# Patient Record
Sex: Female | Born: 1990 | Race: White | Hispanic: No | Marital: Single | Smoking: Current every day smoker
Health system: Southern US, Community
[De-identification: ages and names within clinical notes are randomized; demographics above are authoritative.]

## PROBLEM LIST (undated history)

## (undated) ENCOUNTER — Inpatient Hospital Stay (HOSPITAL_COMMUNITY): Payer: Self-pay

## (undated) DIAGNOSIS — K219 Gastro-esophageal reflux disease without esophagitis: Secondary | ICD-10-CM

## (undated) DIAGNOSIS — D649 Anemia, unspecified: Secondary | ICD-10-CM

## (undated) DIAGNOSIS — F909 Attention-deficit hyperactivity disorder, unspecified type: Secondary | ICD-10-CM

## (undated) DIAGNOSIS — Z87442 Personal history of urinary calculi: Secondary | ICD-10-CM

## (undated) DIAGNOSIS — R0600 Dyspnea, unspecified: Secondary | ICD-10-CM

---
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
## 2011-01-29 LAB — CBC
HCT: 36.5 % (ref 36.0–49.0)
Hemoglobin: 12.6 g/dL (ref 12.0–16.0)
MCHC: 34.6 g/dL (ref 31.0–37.0)
MCV: 86.7 fL (ref 78.0–98.0)
Platelets: 326 10*3/uL (ref 150–400)
RBC: 4.21 MIL/uL (ref 3.80–5.70)
RDW: 12.6 % (ref 11.4–15.5)
WBC: 7.7 10*3/uL (ref 4.5–13.5)

## 2011-01-29 LAB — BASIC METABOLIC PANEL
BUN: 8 mg/dL (ref 6–23)
CO2: 28 mEq/L (ref 19–32)
Calcium: 9.1 mg/dL (ref 8.4–10.5)
Chloride: 107 mEq/L (ref 96–112)
Creatinine, Ser: 0.76 mg/dL (ref 0.4–1.2)
Glucose, Bld: 97 mg/dL (ref 70–99)
Potassium: 3.6 mEq/L (ref 3.5–5.1)
Sodium: 139 mEq/L (ref 135–145)

## 2011-01-29 LAB — PREGNANCY, URINE: Preg Test, Ur: NEGATIVE

## 2011-01-29 LAB — DIFFERENTIAL
Basophils Absolute: 0 10*3/uL (ref 0.0–0.1)
Basophils Relative: 0 % (ref 0–1)
Eosinophils Absolute: 0.2 10*3/uL (ref 0.0–1.2)
Eosinophils Relative: 2 % (ref 0–5)
Lymphocytes Relative: 26 % (ref 24–48)
Lymphs Abs: 2 10*3/uL (ref 1.1–4.8)
Monocytes Absolute: 0.9 10*3/uL (ref 0.2–1.2)
Monocytes Relative: 12 % — ABNORMAL HIGH (ref 3–11)
Neutro Abs: 4.6 10*3/uL (ref 1.7–8.0)
Neutrophils Relative %: 60 % (ref 43–71)

## 2011-01-29 LAB — RAPID URINE DRUG SCREEN, HOSP PERFORMED
Amphetamines: NOT DETECTED
Barbiturates: NOT DETECTED
Benzodiazepines: NOT DETECTED
Cocaine: NOT DETECTED
Opiates: NOT DETECTED
Tetrahydrocannabinol: NOT DETECTED

## 2011-01-29 LAB — ETHANOL: Alcohol, Ethyl (B): <5 mg/dL (ref 0–10)
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
## 2011-07-19 LAB — CBC
HCT: 33.3 — ABNORMAL LOW
Hemoglobin: 11.5 — ABNORMAL LOW
MCHC: 34.7
MCV: 85
Platelets: 319
RBC: 3.92
RDW: 12.7
WBC: 25.1 — ABNORMAL HIGH

## 2011-07-19 LAB — POCT I-STAT, CHEM 8
BUN: 7
Calcium, Ion: 1.2
Chloride: 104
Creatinine, Ser: 0.9
Glucose, Bld: 93
HCT: 35 — ABNORMAL LOW
Hemoglobin: 11.9 — ABNORMAL LOW
Potassium: 4.3
Sodium: 138
TCO2: 22

## 2011-07-19 LAB — TRICYCLICS SCREEN, URINE: TCA Scrn: NOT DETECTED

## 2011-07-19 LAB — DIFFERENTIAL
Basophils Absolute: 0
Basophils Relative: 0
Eosinophils Absolute: 0
Eosinophils Relative: 0
Lymphocytes Relative: 5 — ABNORMAL LOW
Lymphs Abs: 1.3
Monocytes Absolute: 1
Monocytes Relative: 4
Neutro Abs: 22.8 — ABNORMAL HIGH
Neutrophils Relative %: 91 — ABNORMAL HIGH
Smear Review: ADEQUATE
WBC Morphology: INCREASED

## 2011-07-19 LAB — RAPID URINE DRUG SCREEN, HOSP PERFORMED
Amphetamines: NOT DETECTED
Barbiturates: NOT DETECTED
Benzodiazepines: NOT DETECTED
Cocaine: NOT DETECTED
Opiates: NOT DETECTED
Tetrahydrocannabinol: POSITIVE — AB

## 2011-07-19 LAB — ETHANOL: Alcohol, Ethyl (B): <5

## 2011-07-19 LAB — HEPATIC FUNCTION PANEL
ALT: 12
AST: 19
Albumin: 3.5
Alkaline Phosphatase: 80
Bilirubin, Direct: <0.1
Total Bilirubin: 0.7
Total Protein: 5.6 — ABNORMAL LOW

## 2011-07-19 LAB — ACETAMINOPHEN LEVEL: Acetaminophen (Tylenol), Serum: 32.8 — ABNORMAL HIGH

## 2011-07-19 LAB — SALICYLATE LEVEL: Salicylate Lvl: <4

## 2011-07-19 LAB — PREGNANCY, URINE: Preg Test, Ur: NEGATIVE

## 2012-06-14 ENCOUNTER — Other Ambulatory Visit (HOSPITAL_COMMUNITY): Payer: Self-pay | Admitting: Oral Surgery

## 2012-06-22 ENCOUNTER — Encounter (HOSPITAL_COMMUNITY): Payer: Self-pay

## 2012-06-27 ENCOUNTER — Encounter (HOSPITAL_COMMUNITY): Payer: Self-pay | Admitting: *Deleted

## 2012-06-28 ENCOUNTER — Encounter (HOSPITAL_COMMUNITY): Payer: Self-pay | Admitting: Anesthesiology

## 2012-06-28 ENCOUNTER — Encounter (HOSPITAL_COMMUNITY)
Admission: RE | Disposition: A | Discharge status: Home / Self Care | Payer: Self-pay | Source: Ambulatory Visit | Attending: Oral Surgery

## 2012-06-28 ENCOUNTER — Ambulatory Visit (HOSPITAL_COMMUNITY): Payer: Medicaid Other | Admitting: Anesthesiology

## 2012-06-28 ENCOUNTER — Encounter (HOSPITAL_COMMUNITY): Payer: Self-pay | Admitting: *Deleted

## 2012-06-28 ENCOUNTER — Ambulatory Visit (HOSPITAL_COMMUNITY)
Admission: RE | Admit: 2012-06-28 | Discharge: 2012-06-28 | Disposition: A | Discharge status: Home / Self Care | Payer: Medicaid Other | Source: Ambulatory Visit | Attending: Oral Surgery | Admitting: Oral Surgery

## 2012-06-28 DIAGNOSIS — K029 Dental caries, unspecified: Secondary | ICD-10-CM | POA: Insufficient documentation

## 2012-06-28 DIAGNOSIS — F909 Attention-deficit hyperactivity disorder, unspecified type: Secondary | ICD-10-CM | POA: Insufficient documentation

## 2012-06-28 DIAGNOSIS — K05329 Chronic periodontitis, generalized, unspecified severity: Secondary | ICD-10-CM

## 2012-06-28 HISTORY — PX: MULTIPLE EXTRACTIONS WITH ALVEOLOPLASTY: SHX5342

## 2012-06-28 HISTORY — DX: Attention-deficit hyperactivity disorder, unspecified type: F90.9

## 2012-06-28 SURGERY — MULTIPLE EXTRACTION WITH ALVEOLOPLASTY
Anesthesia: General | Site: Mouth | Laterality: Bilateral | Wound class: Clean Contaminated

## 2012-06-28 MED ORDER — LIDOCAINE HCL 2 % IJ SOLN
INTRAMUSCULAR | Status: AC
  Filled 2012-06-28: qty 1

## 2012-06-28 MED ORDER — OXYMETAZOLINE HCL 0.05 % NA SOLN
NASAL | Status: AC
Start: 2012-06-28 — End: 2012-06-28
  Filled 2012-06-28: qty 15

## 2012-06-28 MED ORDER — GLYCOPYRROLATE 0.2 MG/ML IJ SOLN
INTRAMUSCULAR | Status: DC | PRN
  Administered 2012-06-28: 0.6 mg via INTRAVENOUS

## 2012-06-28 MED ORDER — LACTATED RINGERS IV SOLN
INTRAVENOUS | Status: DC | PRN
  Administered 2012-06-28: 11:00:00 via INTRAVENOUS

## 2012-06-28 MED ORDER — LACTATED RINGERS IV SOLN
INTRAVENOUS | Status: DC
  Administered 2012-06-28: 50 mL/h via INTRAVENOUS

## 2012-06-28 MED ORDER — SODIUM CHLORIDE 0.9 % IR SOLN
Status: DC | PRN
  Administered 2012-06-28: 2000 mL

## 2012-06-28 MED ORDER — HYDROMORPHONE HCL PF 1 MG/ML IJ SOLN
INTRAMUSCULAR | Status: AC
  Filled 2012-06-28: qty 1

## 2012-06-28 MED ORDER — OXYMETAZOLINE HCL 0.05 % NA SOLN
NASAL | Status: DC | PRN
  Administered 2012-06-28: 1 via NASAL

## 2012-06-28 MED ORDER — ROCURONIUM BROMIDE 100 MG/10ML IV SOLN
INTRAVENOUS | Status: DC | PRN
  Administered 2012-06-28: 30 mg via INTRAVENOUS

## 2012-06-28 MED ORDER — ONDANSETRON HCL 4 MG/2ML IJ SOLN
INTRAMUSCULAR | Status: DC | PRN
  Administered 2012-06-28: 4 mg via INTRAVENOUS

## 2012-06-28 MED ORDER — CEFAZOLIN SODIUM 1-5 GM-% IV SOLN
INTRAVENOUS | Status: AC
  Filled 2012-06-28: qty 50

## 2012-06-28 MED ORDER — ONDANSETRON HCL 4 MG/2ML IJ SOLN: 4.0000 mg | Freq: Four times a day (QID) | INTRAMUSCULAR | Status: DC | PRN

## 2012-06-28 MED ORDER — FENTANYL CITRATE 0.05 MG/ML IJ SOLN
INTRAMUSCULAR | Status: DC | PRN
  Administered 2012-06-28: 50 ug via INTRAVENOUS

## 2012-06-28 MED ORDER — LIDOCAINE HCL (CARDIAC) 20 MG/ML IV SOLN
INTRAVENOUS | Status: DC | PRN
  Administered 2012-06-28: 80 mg via INTRAVENOUS

## 2012-06-28 MED ORDER — PROPOFOL 10 MG/ML IV EMUL
INTRAVENOUS | Status: DC | PRN
  Administered 2012-06-28: 200 mg via INTRAVENOUS

## 2012-06-28 MED ORDER — MIDAZOLAM HCL 5 MG/5ML IJ SOLN
INTRAMUSCULAR | Status: DC | PRN
  Administered 2012-06-28: 2 mg via INTRAVENOUS

## 2012-06-28 MED ORDER — NEOSTIGMINE METHYLSULFATE 1 MG/ML IJ SOLN
INTRAMUSCULAR | Status: DC | PRN
  Administered 2012-06-28: 4 mg via INTRAVENOUS

## 2012-06-28 MED ORDER — OXYCODONE-ACETAMINOPHEN 5-325 MG PO TABS: 1.0000 | ORAL_TABLET | ORAL | Status: AC | PRN

## 2012-06-28 MED ORDER — LIDOCAINE-EPINEPHRINE 2 %-1:100000 IJ SOLN
INTRAMUSCULAR | Status: DC | PRN
  Administered 2012-06-28: 16 mL

## 2012-06-28 MED ORDER — CEFAZOLIN SODIUM 1-5 GM-% IV SOLN
INTRAVENOUS | Status: DC | PRN
  Administered 2012-06-28: 1 g via INTRAVENOUS

## 2012-06-28 MED ORDER — HYDROMORPHONE HCL PF 1 MG/ML IJ SOLN
0.2500 mg | INTRAMUSCULAR | Status: DC | PRN
  Administered 2012-06-28: 0.5 mg via INTRAVENOUS

## 2012-06-28 SURGICAL SUPPLY — 29 items
BUR CROSS CUT FISSURE 1.6 (BURR) ×2 IMPLANT
BUR EGG ELITE 4.0 (BURR) ×2 IMPLANT
CANISTER SUCTION 2500CC (MISCELLANEOUS) ×2 IMPLANT
CLOTH BEACON ORANGE TIMEOUT ST (SAFETY) ×2 IMPLANT
COVER SURGICAL LIGHT HANDLE (MISCELLANEOUS) ×2 IMPLANT
CRADLE DONUT ADULT HEAD (MISCELLANEOUS) ×2 IMPLANT
DECANTER SPIKE VIAL GLASS SM (MISCELLANEOUS) ×2 IMPLANT
GAUZE PACKING FOLDED 2  STR (GAUZE/BANDAGES/DRESSINGS) ×1
GAUZE PACKING FOLDED 2 STR (GAUZE/BANDAGES/DRESSINGS) ×1 IMPLANT
GLOVE BIO SURGEON STRL SZ 6.5 (GLOVE) ×2 IMPLANT
GLOVE BIO SURGEON STRL SZ7 (GLOVE) ×2 IMPLANT
GLOVE BIO SURGEON STRL SZ7.5 (GLOVE) ×2 IMPLANT
GLOVE BIOGEL PI IND STRL 7.0 (GLOVE) ×1 IMPLANT
GLOVE BIOGEL PI INDICATOR 7.0 (GLOVE) ×1
GOWN STRL NON-REIN LRG LVL3 (GOWN DISPOSABLE) ×4 IMPLANT
GOWN STRL REIN XL XLG (GOWN DISPOSABLE) ×2 IMPLANT
KIT BASIN OR (CUSTOM PROCEDURE TRAY) ×2 IMPLANT
KIT ROOM TURNOVER OR (KITS) ×2 IMPLANT
NEEDLE 22X1 1/2 (OR ONLY) (NEEDLE) ×2 IMPLANT
NS IRRIG 1000ML POUR BTL (IV SOLUTION) ×2 IMPLANT
PAD ARMBOARD 7.5X6 YLW CONV (MISCELLANEOUS) ×4 IMPLANT
SUT CHROMIC 3 0 PS 2 (SUTURE) ×2 IMPLANT
SYR 50ML SLIP (SYRINGE) IMPLANT
SYR CONTROL 10ML LL (SYRINGE) ×2 IMPLANT
TOWEL OR 17X26 10 PK STRL BLUE (TOWEL DISPOSABLE) ×2 IMPLANT
TRAY ENT MC OR (CUSTOM PROCEDURE TRAY) ×2 IMPLANT
TUBING IRRIGATION (MISCELLANEOUS) ×2 IMPLANT
WATER STERILE IRR 1000ML POUR (IV SOLUTION) ×2 IMPLANT
YANKAUER SUCT BULB TIP NO VENT (SUCTIONS) ×2 IMPLANT

## 2012-06-28 NOTE — Anesthesia Postprocedure Evaluation (Signed)
Anesthesia Post Note  Patient: [name]  Procedure(s) Performed: Procedure(s) (LRB): MULTIPLE EXTRACION WITH ALVEOLOPLASTY (Bilateral)  Anesthesia type: General  Patient location: PACU  Post pain: Pain level controlled and Adequate analgesia  Post assessment: Post-op Vital signs reviewed, Patient's Cardiovascular Status Stable, Respiratory Function Stable, Patent Airway and Pain level controlled  Last Vitals:  Filed Vitals:   06/28/12 1303  BP: 152/88  Pulse: 76  Temp:   Resp: 17    Post vital signs: Reviewed and stable  Level of consciousness: awake, alert  and oriented  Complications: No apparent anesthesia complications

## 2012-06-28 NOTE — Op Note (Signed)
06/28/2012  12:21 PM  PATIENT:  Noberto Retort  21 y.o. female  PRE-OPERATIVE DIAGNOSIS:  Non-restorable teeth #'s 2, 3, 4, 5, 6, 7, 8, 9, 10, 11, 13, 14, 19, 20, 22, 23, 24, 25, 26, 27  POST-OPERATIVE DIAGNOSIS:  SAME  PROCEDURE:  Procedure(s): MULTIPLE EXTRACTON Non-restorable teeth #'s 2, 3, 4, 5, 6, 7, 8, 9, 10, 11, 13, 14, 19, 20, 22, 23, 24, 25, 26, 27 WITH ALVEOLOPLASTY  SURGEON:  Surgeon(s): Georgia Lopes, DDS  ANESTHESIA:   local and general  EBL:  minimal  DRAINS: none   SPECIMEN:  No Specimen  COUNTS:  YES  PLAN OF CARE: Discharge to home after PACU  PATIENT DISPOSITION:  PACU - hemodynamically stable.   PROCEDURE DETAILS: Dictation #[id number]  Georgia Lopes, DMD 06/28/2012 12:21 PM

## 2012-06-28 NOTE — Transfer of Care (Signed)
Immediate Anesthesia Transfer of Care Note  Patient: [name]  Procedure(s) Performed: Procedure(s) (LRB) with comments: MULTIPLE EXTRACION WITH ALVEOLOPLASTY (Bilateral) - extract teeth numbers 2,3,4,5,6,7,8,9,10,11,13,14,19,20,22,23,24,26,27 with alveoplasty all quadrants   Patient Location: PACU  Anesthesia Type: General  Level of Consciousness: sedated and patient cooperative  Airway & Oxygen Therapy: Patient Spontanous Breathing and Patient connected to nasal cannula oxygen  Post-op Assessment: Report given to PACU RN, Post -op Vital signs reviewed and stable and Patient moving all extremities X 4  Post vital signs: Reviewed and stable  Complications: No apparent anesthesia complications

## 2012-06-28 NOTE — Anesthesia Preprocedure Evaluation (Addendum)
Anesthesia Evaluation  Patient identified by MRN, date of birth, ID band Patient awake    Reviewed: Allergy & Precautions, H&P , NPO status , Patient's Chart, lab work & pertinent test results  Airway Mallampati: II  Neck ROM: full    Dental  (+) Dental Advisory Given and Poor Dentition   Pulmonary          Cardiovascular     Neuro/Psych ADHD   GI/Hepatic   Endo/Other    Renal/GU      Musculoskeletal   Abdominal   Peds  Hematology   Anesthesia Other Findings   Reproductive/Obstetrics                          Anesthesia Physical Anesthesia Plan  ASA: II  Anesthesia Plan: General   Post-op Pain Management:    Induction: Intravenous  Airway Management Planned: Nasal ETT  Additional Equipment:   Intra-op Plan:   Post-operative Plan: Extubation in OR  Informed Consent: I have reviewed the patients History and Physical, chart, labs and discussed the procedure including the risks, benefits and alternatives for the proposed anesthesia with the patient or authorized representative who has indicated his/her understanding and acceptance.     Plan Discussed with: CRNA and Surgeon  Anesthesia Plan Comments:         Anesthesia Quick Evaluation

## 2012-06-28 NOTE — H&P (Signed)
HISTORY AND PHYSICAL  [name] is a 21 y.o. female patient with CC: painful teeth.  No diagnosis found.  Past Medical History  Diagnosis Date  . ADHD (attention deficit hyperactivity disorder)     Current Facility-Administered Medications  Medication Dose Route Frequency Provider Last Rate Last Dose  . lactated ringers infusion   Intravenous Continuous Georgia Lopes, DDS 50 mL/hr at 06/28/12 1043 50 mL/hr at 06/28/12 1043   Facility-Administered Medications Ordered in Other Encounters  Medication Dose Route Frequency Provider Last Rate Last Dose  . lactated ringers infusion    Continuous PRN Sherie Don, CRNA       No Known Allergies Active Problems:  * No active hospital problems. *   Vitals: Blood pressure 116/77, pulse 86, temperature 98.4 F (36.9 C), temperature source Oral, resp. rate 16, height 5\' 2"  (1.575 m), weight 86.5 kg (190 lb 11.2 oz), SpO2 100.00%. Lab results:No results found for this or any previous visit (from the past 24 hour(s)). Radiology Results: No results found. General appearance: alert, cooperative, appears stated age and no distress Head: Normocephalic, without obvious abnormality, atraumatic Eyes: negative Ears: normal TM's and external ear canals both ears Nose: Nares normal. Septum midline. Mucosa normal. No drainage or sinus tenderness. Throat: severe dental caries, periodontal disease all remaining teeth Neck: no adenopathy and supple, symmetrical, trachea midline Resp: clear to auscultation bilaterally Cardio: regular rate and rhythm, S1, S2 normal, no murmur, click, rub or gallop GI: soft, non-tender; bowel sounds normal; no masses,  no organomegaly  Assessment: 21 YO WF ADHD, mild obesity, with all remaining teeth non-restorable due to dental caries and severe periodontitis.   Plan: Full mouth dental extractions, alveoloplasty. General anesthesia day surgery.   Erez Mccallum M 06/28/2012

## 2012-06-28 NOTE — OR Nursing (Signed)
Throat pack in at 11:44

## 2012-06-28 NOTE — Anesthesia Procedure Notes (Signed)
Procedure Name: Intubation Date/Time: 06/28/2012 11:22 AM Performed by: Sherie Don Pre-anesthesia Checklist: Patient identified, Emergency Drugs available, Suction available, Patient being monitored and Timeout performed Patient Re-evaluated:Patient Re-evaluated prior to inductionOxygen Delivery Method: Circle system utilized Preoxygenation: Pre-oxygenation with 100% oxygen Intubation Type: IV induction Ventilation: Mask ventilation without difficulty Laryngoscope Size: Mac and 3 Grade View: Grade II Nasal Tubes: Magill forceps - small, utilized, Right and Nasal prep performed Tube size: 7.0 mm Number of attempts: 1 Placement Confirmation: ETT inserted through vocal cords under direct vision,  positive ETCO2 and breath sounds checked- equal and bilateral Tube secured with: Tape Dental Injury: Teeth and Oropharynx as per pre-operative assessment

## 2012-06-28 NOTE — Preoperative (Signed)
Beta Blockers   Reason not to administer Beta Blockers:Not Applicable 

## 2012-06-29 ENCOUNTER — Encounter (HOSPITAL_COMMUNITY): Payer: Self-pay | Admitting: Oral Surgery

## 2012-06-29 NOTE — Op Note (Signed)
[name], [name]             ACCOUNT NO.:  [id number]  MEDICAL RECORD NO.:  [id number]  LOCATION:  [facility]                         FACILITY:  [facility]  PHYSICIAN:  Georgia Lopes, M.D.  DATE OF BIRTH:  [birth year]  DATE OF PROCEDURE:  06/28/2012 DATE OF DISCHARGE:  06/28/2012                              OPERATIVE REPORT   PREOPERATIVE DIAGNOSIS:  Nonrestorable teeth numbers 2, 3, 4, 5, 6, 7, 8, 9, 10, 11, 13, 14, 19, 20, 22, 23, 24, 25, 26, 27.  POSTOPERATIVE DIAGNOSIS:  Nonrestorable teeth numbers 2, 3, 4, 5, 6, 7, 8, 9, 10, 11, 13, 14, 19, 20, 22, 23, 24, 25, 26, 27.  PROCEDURES:  Removal of teeth numbers 2, 3, 4, 5, 6, 7, 8, 9, 10, 11, 13, 14, 19, 20, 22, 23, 24, 25, 26, 27.  Alveoplasty, right and left maxilla and mandible.  SURGEON:  Georgia Lopes, MD  ANESTHESIA:  General, nasal.  INDICATIONS FOR PROCEDURE:  [name] is a 21 year old female who was referred to me by her general dentist for removal of all remaining teeth.  She had previously undergone extraction in my office under intravenous sedation of teeth numbers 1, 12, 16, 21, 28, 29, and 30 secondary to dental decay.  She was examined and found to have gross dental caries and periodontal disease around all remaining teeth. Because of the condition of the teeth, it was recommended that full mouth extraction will be performed with alveoplasty.  General anesthesia was recommended for the case because of the extensiveness of the surgery.  PROCEDURE:  The patient was taken to the operating room and placed on the table in the supine position.  General anesthesia was administered intravenously and a nasal endotracheal tube was placed and secured.  The eyes were protected.  The patient was draped for the procedure.  Time- out was performed.  The posterior pharynx was suctioned and a throat pack was placed.  A 2% lidocaine with 1:100,000 epinephrine was infiltrated in the inferior alveolar nerve block on the right and  left side and then buccal and palatal infiltration of the maxilla, total of 16 mL was utilized.  Then, a bite block and sweetheart retractor was placed inside the mouth and a 15-blade was used to make a full-thickness incision beginning in the left mandible starting at tooth number 19, carrying anteriorly, buckling and lingually to tooth number 27.  In the maxilla on the left side, the 15-blade was used to make a full-thickness incision around teeth numbers 13, 14, 11, 10, 9, 8, 7, 6 on the buccal and palatal aspects.  Then, the periosteal elevator was used to reflect the periosteum from around these teeth and this handpiece under irrigation was used to remove circumferential bone around teeth numbers 11, 13, 14, 19, 20, and 22.  The teeth were then elevated with a 301 elevator and removed from the mouth with the Asch forceps in the lower anterior and the upper #150 forceps in the maxilla.  Then, the sockets were curetted.  The periosteum was further reflected to expose the alveolar crest with the periosteal elevator in the mandible.  A Seldin retractor was used to retract the lingual tissues and the  egg-shaped bur was then used under irrigation to perform the alveoplasty and was further smoothed with a bone file.  In the maxilla, after the periosteum was reflected, the egg-shaped bur and bone file were used to perform the alveoplasty in the left maxilla.  Then, the left maxilla and mandible were irrigated and closed with 3-0 chromic.  The bite block was repositioned as was the sweetheart retractor and the 15-blade was used to make a full-thickness incision around teeth numbers 2, 3, 4, 5, and 6 in the maxilla on the buccal and palatal aspects and in the mandible around tooth number 27 on the buccal and lingual aspects and with a distal incision of approximately 1 cm to remove a wedge of tissue to allow for primary closure.  Periosteum was reflected.  Bone was removed with the Striker  handpiece under irrigation.  The teeth were elevated with a 301 elevator and removed from the mouth with a dental forceps. The sockets were then curetted and then, the periosteum was further reflected, alveoplasty was performed with the egg-shaped bur and the bone file, and then the areas were irrigated and closed with 3-0 chromic.  Then, the oral cavity was inspected, found to have good contour, hemostasis and closure.  The oral cavity was irrigated and suctioned, and the throat pack was removed.  The patient was awakened and taken to the recovery room, breathing spontaneously in good condition.  EBL:  Minimum.  COMPLICATIONS:  None.  SPECIMENS:  None.     Georgia Lopes, M.D.     SMJ/MEDQ  D:  06/28/2012  T:  06/29/2012  Job:  [id number]

## 2012-07-04 ENCOUNTER — Other Ambulatory Visit (HOSPITAL_COMMUNITY): Payer: Medicaid Other

## 2012-08-04 ENCOUNTER — Emergency Department (HOSPITAL_COMMUNITY): Payer: Medicaid Other

## 2012-08-04 ENCOUNTER — Encounter (HOSPITAL_COMMUNITY): Payer: Self-pay | Admitting: *Deleted

## 2012-08-04 ENCOUNTER — Emergency Department (HOSPITAL_COMMUNITY)
Admission: EM | Admit: 2012-08-04 | Discharge: 2012-08-04 | Disposition: A | Discharge status: Home / Self Care | Payer: Medicaid Other | Attending: Emergency Medicine | Admitting: Emergency Medicine

## 2012-08-04 DIAGNOSIS — R109 Unspecified abdominal pain: Secondary | ICD-10-CM | POA: Insufficient documentation

## 2012-08-04 DIAGNOSIS — Z79899 Other long term (current) drug therapy: Secondary | ICD-10-CM | POA: Insufficient documentation

## 2012-08-04 DIAGNOSIS — F909 Attention-deficit hyperactivity disorder, unspecified type: Secondary | ICD-10-CM | POA: Insufficient documentation

## 2012-08-04 DIAGNOSIS — F17200 Nicotine dependence, unspecified, uncomplicated: Secondary | ICD-10-CM | POA: Insufficient documentation

## 2012-08-04 DIAGNOSIS — N390 Urinary tract infection, site not specified: Secondary | ICD-10-CM

## 2012-08-04 DIAGNOSIS — R339 Retention of urine, unspecified: Secondary | ICD-10-CM | POA: Insufficient documentation

## 2012-08-04 LAB — URINALYSIS, ROUTINE W REFLEX MICROSCOPIC
Bilirubin Urine: NEGATIVE
Glucose, UA: NEGATIVE mg/dL
Ketones, ur: NEGATIVE mg/dL
Nitrite: NEGATIVE
Protein, ur: NEGATIVE mg/dL
Specific Gravity, Urine: 1.01 (ref 1.005–1.030)
Urobilinogen, UA: 0.2 mg/dL (ref 0.0–1.0)
pH: 7 (ref 5.0–8.0)

## 2012-08-04 LAB — URINE MICROSCOPIC-ADD ON

## 2012-08-04 LAB — PREGNANCY, URINE: Preg Test, Ur: NEGATIVE

## 2012-08-04 MED ORDER — ONDANSETRON HCL 4 MG/2ML IJ SOLN
4.0000 mg | Freq: Once | INTRAMUSCULAR | Status: AC
  Administered 2012-08-04: 4 mg via INTRAVENOUS
  Filled 2012-08-04: qty 2

## 2012-08-04 MED ORDER — PROMETHAZINE HCL 25 MG PO TABS: 25.0000 mg | ORAL_TABLET | Freq: Four times a day (QID) | ORAL | Status: DC | PRN

## 2012-08-04 MED ORDER — SODIUM CHLORIDE 0.9 % IV BOLUS (SEPSIS)
500.0000 mL | Freq: Once | INTRAVENOUS | Status: AC
  Administered 2012-08-04: 500 mL via INTRAVENOUS

## 2012-08-04 MED ORDER — OXYCODONE-ACETAMINOPHEN 5-325 MG PO TABS: 1.0000 | ORAL_TABLET | Freq: Four times a day (QID) | ORAL | Status: DC | PRN

## 2012-08-04 MED ORDER — NITROFURANTOIN MONOHYD MACRO 100 MG PO CAPS: 100.0000 mg | ORAL_CAPSULE | Freq: Two times a day (BID) | ORAL | Status: DC

## 2012-08-04 MED ORDER — KETOROLAC TROMETHAMINE 30 MG/ML IJ SOLN
30.0000 mg | Freq: Once | INTRAMUSCULAR | Status: AC
  Administered 2012-08-04: 30 mg via INTRAVENOUS
  Filled 2012-08-04: qty 1

## 2012-08-04 NOTE — ED Provider Notes (Signed)
History   This chart was scribed for Michelle Hutching, MD scribed by Magnus Sinning. The patient was seen in room APA03/[facility]03 at 19:42   CSN: [id number]  Arrival date & time 08/04/12  1832   Chief Complaint  Patient presents with  . Flank Pain    (Consider location/radiation/quality/duration/timing/severity/associated sxs/prior treatment) The history is provided by the patient. No language interpreter was used.   [name] is a 21 y.o. female who presents to the Emergency Department complaining of constant moderate LLQ pain that radiates to left flank, onset last night and worsening this morning. Pt states she started having LLQ pain yesterday, but explains it began to radiate into left flank this morning and with associated urinary retention today. Patient states she does consume several sugary carbonated beverages, but states she has recently attempted to drink more water. Pt states that she's been eating normally and denies emesis, diarrhea, vaginal bleeding, or vaginal discharge. LNMP: Over a year, due to Depo-provera shot, which was last administered yesterday morning.  Past Medical History  Diagnosis Date  . ADHD (attention deficit hyperactivity disorder)     Past Surgical History  Procedure Date  . No past surgeries   . Multiple extractions with alveoloplasty 06/28/2012    Procedure: MULTIPLE EXTRACION WITH ALVEOLOPLASTY;  Surgeon: Georgia Lopes, DDS;  Location: [facility] OR;  Service: Oral Surgery;  Laterality: Bilateral;  extract teeth numbers 2,3,4,5,6,7,8,9,10,11,13,14,19,20,22,23,24,26,27 with alveoplasty all quadrants     No family history on file.  History  Substance Use Topics  . Smoking status: Current Every Day Smoker -- 1.0 packs/day for 7 years    Types: Cigarettes  . Smokeless tobacco: Not on file  . Alcohol Use: Yes     maybe once a month- maybe 2 shots   Review of Systems  All other systems reviewed and are negative.   10 Systems reviewed and are negative  for acute change except as noted in the HPI. Allergies  Review of patient's allergies indicates no known allergies.  Home Medications   Current Outpatient Rx  Name Route Sig Dispense Refill  . AMPHETAMINE-DEXTROAMPHET ER 10 MG PO CP24 Oral Take 10 mg by mouth daily.    Marland Kitchen MEDROXYPROGESTERONE ACETATE 150 MG/ML IM SUSP Intramuscular Inject 150 mg into the muscle every 3 (three) months.    Marland Kitchen VALACYCLOVIR HCL 1 G PO TABS Oral Take 2,000 mg by mouth 2 (two) times daily as needed. For cold sores      BP 143/92  Pulse 84  Temp 99.4 F (37.4 C) (Oral)  Resp 18  Ht 5\' 2"  (1.575 m)  Wt 185 lb (83.915 kg)  BMI 33.84 kg/m2  SpO2 100%  Physical Exam  Nursing note and vitals reviewed. Constitutional: She is oriented to person, place, and time. She appears well-developed and well-nourished. No distress.  HENT:  Head: Normocephalic and atraumatic.  Eyes: Conjunctivae normal and EOM are normal. Pupils are equal, round, and reactive to light.  Neck: Normal range of motion. Neck supple.  Cardiovascular: Normal rate, regular rhythm and normal heart sounds.   Pulmonary/Chest: Effort normal and breath sounds normal.  Abdominal: Soft. Bowel sounds are normal.       Mildy Tender at LLQ and left flank  Musculoskeletal: Normal range of motion.  Neurological: She is alert and oriented to person, place, and time.  Skin: Skin is warm and dry.  Psychiatric: She has a normal mood and affect.    ED Course  Procedures (including critical care time) DIAGNOSTIC STUDIES:  Oxygen Saturation is 100% on room air, normal by my interpretation.    COORDINATION OF CARE: 19:45: Informed to check urine for infection. Pt   Labs Reviewed  PREGNANCY, URINE  URINALYSIS, ROUTINE W REFLEX MICROSCOPIC   Ct Abdomen Pelvis Wo Contrast  08/04/2012  *RADIOLOGY REPORT*  Clinical Data: Left flank pain, dysuria  CT ABDOMEN AND PELVIS WITHOUT CONTRAST  Technique:  Multidetector CT imaging of the abdomen and pelvis was  performed following the standard protocol without intravenous contrast.  Comparison: None.  Findings: Lung bases are clear.  Mild hepatic steatosis.  Spleen, pancreas, and adrenal glands are within normal limits.  Gallbladder is unremarkable.  No intrahepatic or extrahepatic ductal dilatation.  Kidneys are unremarkable.  No renal calculi or hydronephrosis.  No evidence of bowel obstruction.  Normal appendix.  No evidence of abdominal aortic aneurysm.  No abdominopelvic ascites.  No suspicious abdominopelvic lymphadenopathy.  Uterus and bilateral ovaries are within normal limits.  No ureteral or bladder calculi.  Visualized osseous structures are within normal limits.  IMPRESSION: No renal, ureteral, or bladder calculi.  No hydronephrosis.  No CT findings to account for the patient's left flank pain.  Mild hepatic steatosis.   Original Report Authenticated By: Charline Bills, M.D.      No diagnosis found.  Urinalysis shows large hemoglobin, 7-10 white cells, 3-6 red cells  MDM  History suggestive of kidney stone pain.  CT scan negative.  Will Rx Macrobid, Percocet, Phenergan.   No acute abdomen at discharge    I personally performed the services described in this documentation, which was scribed in my presence. The recorded information has been reviewed and considered.         Michelle Hutching, MD 08/04/12 2255

## 2012-08-04 NOTE — ED Notes (Signed)
Patient waiting to go to CT

## 2012-08-04 NOTE — ED Notes (Signed)
Left flank pain since last night, + burning on urination x 1, denies N/V/D

## 2012-08-04 NOTE — ED Notes (Addendum)
States she started having pain in her left groin last night, states that the pain radiates from her left groin to her left back.  States she did have some burning with urination, but no difficulty passing her urine.  Denies nausea, vomiting or hematuria.  Appears in no acute distress at present.

## 2014-06-07 ENCOUNTER — Encounter (HOSPITAL_COMMUNITY): Payer: Self-pay | Admitting: Emergency Medicine

## 2014-06-07 ENCOUNTER — Emergency Department (INDEPENDENT_AMBULATORY_CARE_PROVIDER_SITE_OTHER): Payer: Managed Care, Other (non HMO)

## 2014-06-07 ENCOUNTER — Emergency Department (INDEPENDENT_AMBULATORY_CARE_PROVIDER_SITE_OTHER)
Admission: EM | Admit: 2014-06-07 | Discharge: 2014-06-07 | Disposition: A | Discharge status: Home / Self Care | Payer: Managed Care, Other (non HMO) | Source: Home / Self Care | Attending: Family Medicine | Admitting: Family Medicine

## 2014-06-07 DIAGNOSIS — X58XXXA Exposure to other specified factors, initial encounter: Secondary | ICD-10-CM

## 2014-06-07 DIAGNOSIS — S93609A Unspecified sprain of unspecified foot, initial encounter: Secondary | ICD-10-CM

## 2014-06-07 DIAGNOSIS — S93601A Unspecified sprain of right foot, initial encounter: Secondary | ICD-10-CM

## 2014-06-07 NOTE — ED Notes (Signed)
C/o right foot states pain started three weeks ago Sharp pain on bottom and on top of foot  Ice and goody powder was used as tx

## 2014-06-07 NOTE — ED Provider Notes (Signed)
CSN: [id number]     Arrival date & time 06/07/14  1417 History   First MD Initiated Contact with Patient 06/07/14 1440     Chief Complaint  Patient presents with  . Foot Pain   (Consider location/radiation/quality/duration/timing/severity/associated sxs/prior Treatment) Patient is a 23 y.o. female presenting with lower extremity pain. The history is provided by the patient.  Foot Pain This is a new problem. The current episode started more than 1 week ago (3 wks of sx, NKI but thinks she may have rolled foot over.). The problem has been gradually worsening. The symptoms are aggravated by walking.    Past Medical History  Diagnosis Date  . ADHD (attention deficit hyperactivity disorder)    Past Surgical History  Procedure Laterality Date  . No past surgeries    . Multiple extractions with alveoloplasty  06/28/2012    Procedure: MULTIPLE EXTRACION WITH ALVEOLOPLASTY;  Surgeon: Georgia Lopes, DDS;  Location: [facility] OR;  Service: Oral Surgery;  Laterality: Bilateral;  extract teeth numbers 2,3,4,5,6,7,8,9,10,11,13,14,19,20,22,23,24,26,27 with alveoplasty all quadrants    History reviewed. No pertinent family history. History  Substance Use Topics  . Smoking status: Current Every Day Smoker -- 1.00 packs/day for 7 years    Types: Cigarettes  . Smokeless tobacco: Not on file  . Alcohol Use: Yes     Comment: maybe once a month- maybe 2 shots   OB History   Grav Para Term Preterm Abortions TAB SAB Ect Mult Living                 Review of Systems  Constitutional: Negative.   Musculoskeletal: Positive for gait problem. Negative for joint swelling and myalgias.    Allergies  Review of patient's allergies indicates no known allergies.  Home Medications   Prior to Admission medications   Medication Sig Start Date End Date Taking? Authorizing Provider  amphetamine-dextroamphetamine (ADDERALL XR) 10 MG 24 hr capsule Take 10 mg by mouth daily.    Historical Provider, MD   HYDROcodone-acetaminophen (NORCO) 10-325 MG per tablet Take 1 tablet by mouth every 4 (four) hours as needed. pain    Historical Provider, MD  medroxyPROGESTERone (DEPO-PROVERA) 150 MG/ML injection Inject 150 mg into the muscle every 3 (three) months.    Historical Provider, MD  nitrofurantoin, macrocrystal-monohydrate, (MACROBID) 100 MG capsule Take 1 capsule (100 mg total) by mouth 2 (two) times daily. X 7 days 08/04/12   Donnetta Hutching, MD  oxyCODONE-acetaminophen (PERCOCET/ROXICET) 5-325 MG per tablet Take 1-2 tablets by mouth every 6 (six) hours as needed for pain. 08/04/12   Donnetta Hutching, MD  promethazine (PHENERGAN) 25 MG tablet Take 1 tablet (25 mg total) by mouth every 6 (six) hours as needed for nausea. 08/04/12   Donnetta Hutching, MD  valACYclovir (VALTREX) 1000 MG tablet Take 2,000 mg by mouth 2 (two) times daily as needed. For cold sores    Historical Provider, MD   BP 135/92  Pulse 90  Temp(Src) 98.5 F (36.9 C) (Oral)  Resp 18  SpO2 98% Physical Exam  Nursing note and vitals reviewed. Constitutional: She is oriented to person, place, and time. She appears well-developed and well-nourished.  Musculoskeletal: She exhibits tenderness.  Tender 5th mt of right foot, no sts or deformity.  Neurological: She is alert and oriented to person, place, and time.  Skin: Skin is warm and dry.    ED Course  Procedures (including critical care time) Labs Review Labs Reviewed - No data to display  Imaging Review Dg Foot Complete  Right  06/07/2014   CLINICAL DATA:  Lateral right foot pain.  EXAM: RIGHT FOOT COMPLETE - 3+ VIEW  COMPARISON:  None.  FINDINGS: There is no acute bony or joint abnormality. Hallux valgus deformity is noted. Soft tissues are unremarkable.  IMPRESSION: No acute finding.   Electronically Signed   By: Drusilla Kannerhomas  Dalessio M.D.   On: 06/07/2014 15:49    X-rays reviewed and report per radiologist.  MDM   1. Sprain of foot, right, initial encounter        Linna HoffJames D Venezia Sargeant,  MD 06/07/14 [phone]

## 2014-06-07 NOTE — Discharge Instructions (Signed)
advil and firm soled shoe as needed soak in warm water nightly for comfort and swelling.

## 2014-10-08 ENCOUNTER — Encounter (HOSPITAL_COMMUNITY): Payer: Self-pay | Admitting: Emergency Medicine

## 2014-10-08 ENCOUNTER — Emergency Department (INDEPENDENT_AMBULATORY_CARE_PROVIDER_SITE_OTHER)
Admission: EM | Admit: 2014-10-08 | Discharge: 2014-10-08 | Disposition: A | Discharge status: Home / Self Care | Payer: Managed Care, Other (non HMO) | Source: Home / Self Care | Attending: Emergency Medicine | Admitting: Emergency Medicine

## 2014-10-08 DIAGNOSIS — J069 Acute upper respiratory infection, unspecified: Secondary | ICD-10-CM | POA: Diagnosis not present

## 2014-10-08 DIAGNOSIS — Z720 Tobacco use: Secondary | ICD-10-CM | POA: Diagnosis not present

## 2014-10-08 DIAGNOSIS — J9801 Acute bronchospasm: Secondary | ICD-10-CM

## 2014-10-08 MED ORDER — IPRATROPIUM-ALBUTEROL 0.5-2.5 (3) MG/3ML IN SOLN
3.0000 mL | Freq: Once | RESPIRATORY_TRACT | Status: AC
  Administered 2014-10-08: 3 mL via RESPIRATORY_TRACT

## 2014-10-08 MED ORDER — ALBUTEROL SULFATE HFA 108 (90 BASE) MCG/ACT IN AERS: 2.0000 | INHALATION_SPRAY | RESPIRATORY_TRACT | Status: DC | PRN

## 2014-10-08 MED ORDER — METHYLPREDNISOLONE SODIUM SUCC 125 MG IJ SOLR
INTRAMUSCULAR | Status: AC
  Filled 2014-10-08: qty 2

## 2014-10-08 MED ORDER — PREDNISONE 20 MG PO TABS: ORAL_TABLET | ORAL | Status: DC

## 2014-10-08 MED ORDER — METHYLPREDNISOLONE SODIUM SUCC 125 MG IJ SOLR
80.0000 mg | Freq: Once | INTRAMUSCULAR | Status: AC
  Administered 2014-10-08: 80 mg via INTRAMUSCULAR

## 2014-10-08 MED ORDER — IPRATROPIUM-ALBUTEROL 0.5-2.5 (3) MG/3ML IN SOLN
RESPIRATORY_TRACT | Status: AC
  Filled 2014-10-08: qty 3

## 2014-10-08 MED ORDER — ALBUTEROL SULFATE (2.5 MG/3ML) 0.083% IN NEBU
INHALATION_SOLUTION | RESPIRATORY_TRACT | Status: AC
  Filled 2014-10-08: qty 3

## 2014-10-08 MED ORDER — ALBUTEROL SULFATE (2.5 MG/3ML) 0.083% IN NEBU
2.5000 mg | INHALATION_SOLUTION | Freq: Once | RESPIRATORY_TRACT | Status: AC
  Administered 2014-10-08: 2.5 mg via RESPIRATORY_TRACT

## 2014-10-08 NOTE — Discharge Instructions (Signed)
Bronchospasm A bronchospasm is a spasm or tightening of the airways going into the lungs. During a bronchospasm breathing becomes more difficult because the airways get smaller. When this happens there can be coughing, a whistling sound when breathing (wheezing), and difficulty breathing. Bronchospasm is often associated with asthma, but not all patients who experience a bronchospasm have asthma. CAUSES  A bronchospasm is caused by inflammation or irritation of the airways. The inflammation or irritation may be triggered by:   Allergies (such as to animals, pollen, food, or mold). Allergens that cause bronchospasm may cause wheezing immediately after exposure or many hours later.   Infection. Viral infections are believed to be the most common cause of bronchospasm.   Exercise.   Irritants (such as pollution, cigarette smoke, strong odors, aerosol sprays, and paint fumes).   Weather changes. Winds increase molds and pollens in the air. Rain refreshes the air by washing irritants out. Cold air may cause inflammation.   Stress and emotional upset.  SIGNS AND SYMPTOMS   Wheezing.   Excessive nighttime coughing.   Frequent or severe coughing with a simple cold.   Chest tightness.   Shortness of breath.  DIAGNOSIS  Bronchospasm is usually diagnosed through a history and physical exam. Tests, such as chest X-rays, are sometimes done to look for other conditions. TREATMENT   Inhaled medicines can be given to open up your airways and help you breathe. The medicines can be given using either an inhaler or a nebulizer machine.  Corticosteroid medicines may be given for severe bronchospasm, usually when it is associated with asthma. HOME CARE INSTRUCTIONS   Always have a plan prepared for seeking medical care. Know when to call your health care provider and local emergency services (911 in the [place] Know where you can access local emergency care.  Only take medicines as  directed by your health care provider.  If you were prescribed an inhaler or nebulizer machine, ask your health care provider to explain how to use it correctly. Always use a spacer with your inhaler if you were given one.  It is necessary to remain calm during an attack. Try to relax and breathe more slowly.  Control your home environment in the following ways:   Change your heating and air conditioning filter at least once a month.   Limit your use of fireplaces and wood stoves.  Do not smoke and do not allow smoking in your home.   Avoid exposure to perfumes and fragrances.   Get rid of pests (such as roaches and mice) and their droppings.   Throw away plants if you see mold on them.   Keep your house clean and dust free.   Replace carpet with wood, tile, or vinyl flooring. Carpet can trap dander and dust.   Use allergy-proof pillows, mattress covers, and box spring covers.   Wash bed sheets and blankets every week in hot water and dry them in a dryer.   Use blankets that are made of polyester or cotton.   Wash hands frequently. SEEK MEDICAL CARE IF:   You have muscle aches.   You have chest pain.   The sputum changes from clear or white to yellow, green, gray, or bloody.   The sputum you cough up gets thicker.   There are problems that may be related to the medicine you are given, such as a rash, itching, swelling, or trouble breathing.  SEEK IMMEDIATE MEDICAL CARE IF:   You have worsening wheezing and coughing even  after taking your prescribed medicines.   You have increased difficulty breathing.   You develop severe chest pain. MAKE SURE YOU:   Understand these instructions.  Will watch your condition.  Will get help right away if you are not doing well or get worse. Document Released: 10/13/2003 Document Revised: 10/15/2013 Document Reviewed: 04/01/2013 [facility] Patient Information 2015 [place], [place]. This information is not  intended to replace advice given to you by your health care provider. Make sure you discuss any questions you have with your health care provider.  Cough, Adult  A cough is a reflex. It helps you clear your throat and airways. A cough can help heal your body. A cough can last 2 or 3 weeks (acute) or may last more than 8 weeks (chronic). Some common causes of a cough can include an infection, allergy, or a cold. HOME CARE  Only take medicine as told by your doctor.  If given, take your medicines (antibiotics) as told. Finish them even if you start to feel better.  Use a cold steam vaporizer or humidifier in your home. This can help loosen thick spit (secretions).  Sleep so you are almost sitting up (semi-upright). Use pillows to do this. This helps reduce coughing.  Rest as needed.  Stop smoking if you smoke. GET HELP RIGHT AWAY IF:  You have yellowish-white fluid (pus) in your thick spit.  Your cough gets worse.  Your medicine does not reduce coughing, and you are losing sleep.  You cough up blood.  You have trouble breathing.  Your pain gets worse and medicine does not help.  You have a fever. MAKE SURE YOU:   Understand these instructions.  Will watch your condition.  Will get help right away if you are not doing well or get worse. Document Released: 06/23/2011 Document Revised: 02/24/2014 Document Reviewed: 06/23/2011 [facility] Patient Information 2015 [place], [place]. This information is not intended to replace advice given to you by your health care provider. Make sure you discuss any questions you have with your health care provider.  Upper Respiratory Infection, Adult An upper respiratory infection (URI) is also sometimes known as the common cold. The upper respiratory tract includes the nose, sinuses, throat, trachea, and bronchi. Bronchi are the airways leading to the lungs. Most people improve within 1 week, but symptoms can last up to 2 weeks. A residual cough may  last even longer.  CAUSES Many different viruses can infect the tissues lining the upper respiratory tract. The tissues become irritated and inflamed and often become very moist. Mucus production is also common. A cold is contagious. You can easily spread the virus to others by oral contact. This includes kissing, sharing a glass, coughing, or sneezing. Touching your mouth or nose and then touching a surface, which is then touched by another person, can also spread the virus. SYMPTOMS  Symptoms typically develop 1 to 3 days after you come in contact with a cold virus. Symptoms vary from person to person. They may include:  Runny nose.  Sneezing.  Nasal congestion.  Sinus irritation.  Sore throat.  Loss of voice (laryngitis).  Cough.  Fatigue.  Muscle aches.  Loss of appetite.  Headache.  Low-grade fever. DIAGNOSIS  You might diagnose your own cold based on familiar symptoms, since most people get a cold 2 to 3 times a year. Your caregiver can confirm this based on your exam. Most importantly, your caregiver can check that your symptoms are not due to another disease such as strep  throat, sinusitis, pneumonia, asthma, or epiglottitis. Blood tests, throat tests, and X-rays are not necessary to diagnose a common cold, but they may sometimes be helpful in excluding other more serious diseases. Your caregiver will decide if any further tests are required. RISKS AND COMPLICATIONS  You may be at risk for a more severe case of the common cold if you smoke cigarettes, have chronic heart disease (such as heart failure) or lung disease (such as asthma), or if you have a weakened immune system. The very young and very old are also at risk for more serious infections. Bacterial sinusitis, middle ear infections, and bacterial pneumonia can complicate the common cold. The common cold can worsen asthma and chronic obstructive pulmonary disease (COPD). Sometimes, these complications can require  emergency medical care and may be life-threatening. PREVENTION  The best way to protect against getting a cold is to practice good hygiene. Avoid oral or hand contact with people with cold symptoms. Wash your hands often if contact occurs. There is no clear evidence that vitamin C, vitamin E, echinacea, or exercise reduces the chance of developing a cold. However, it is always recommended to get plenty of rest and practice good nutrition. TREATMENT  Treatment is directed at relieving symptoms. There is no cure. Antibiotics are not effective, because the infection is caused by a virus, not by bacteria. Treatment may include:  Increased fluid intake. Sports drinks offer valuable electrolytes, sugars, and fluids.  Breathing heated mist or steam (vaporizer or shower).  Eating chicken soup or other clear broths, and maintaining good nutrition.  Getting plenty of rest.  Using gargles or lozenges for comfort.  Controlling fevers with ibuprofen or acetaminophen as directed by your caregiver.  Increasing usage of your inhaler if you have asthma. Zinc gel and zinc lozenges, taken in the first 24 hours of the common cold, can shorten the duration and lessen the severity of symptoms. Pain medicines may help with fever, muscle aches, and throat pain. A variety of non-prescription medicines are available to treat congestion and runny nose. Your caregiver can make recommendations and may suggest nasal or lung inhalers for other symptoms.  HOME CARE INSTRUCTIONS   Only take over-the-counter or prescription medicines for pain, discomfort, or fever as directed by your caregiver.  Use a warm mist humidifier or inhale steam from a shower to increase air moisture. This may keep secretions moist and make it easier to breathe.  Drink enough water and fluids to keep your urine clear or pale yellow.  Rest as needed.  Return to work when your temperature has returned to normal or as your caregiver advises. You  may need to stay home longer to avoid infecting others. You can also use a face mask and careful hand washing to prevent spread of the virus. SEEK MEDICAL CARE IF:   After the first few days, you feel you are getting worse rather than better.  You need your caregiver's advice about medicines to control symptoms.  You develop chills, worsening shortness of breath, or brown or red sputum. These may be signs of pneumonia.  You develop yellow or brown nasal discharge or pain in the face, especially when you bend forward. These may be signs of sinusitis.  You develop a fever, swollen neck glands, pain with swallowing, or white areas in the back of your throat. These may be signs of strep throat. SEEK IMMEDIATE MEDICAL CARE IF:   You have a fever.  You develop severe or persistent headache, ear pain, sinus  pain, or chest pain.  You develop wheezing, a prolonged cough, cough up blood, or have a change in your usual mucus (if you have chronic lung disease).  You develop sore muscles or a stiff neck. Document Released: 04/05/2001 Document Revised: 01/02/2012 Document Reviewed: 01/15/2014 [facility] Patient Information 2015 [place]. This information is not intended to replace advice given to you by your health care provider. Make sure you discuss any questions you have with your health care provider.

## 2014-10-08 NOTE — ED Provider Notes (Signed)
CSN: [id number]     Arrival date & time 10/08/14  1204 History   First MD Initiated Contact with Patient 10/08/14 1233     Chief Complaint  Patient presents with  . URI   (Consider location/radiation/quality/duration/timing/severity/associated sxs/prior Treatment) HPI Comments: 23 year old female complaining of a cough for 3 weeks. Associated with upper respiratory congestion, runny nose, sensation of years popping, ribs hurting associated with a cough. She smokes approximately one half pack per day.   Past Medical History  Diagnosis Date  . ADHD (attention deficit hyperactivity disorder)    Past Surgical History  Procedure Laterality Date  . No past surgeries    . Multiple extractions with alveoloplasty  06/28/2012    Procedure: MULTIPLE EXTRACION WITH ALVEOLOPLASTY;  Surgeon: Georgia LopesScott M Jensen, DDS;  Location: [facility] OR;  Service: Oral Surgery;  Laterality: Bilateral;  extract teeth numbers 2,3,4,5,6,7,8,9,10,11,13,14,19,20,22,23,24,26,27 with alveoplasty all quadrants    No family history on file. History  Substance Use Topics  . Smoking status: Current Every Day Smoker -- 1.00 packs/day for 7 years    Types: Cigarettes  . Smokeless tobacco: Not on file  . Alcohol Use: Yes     Comment: maybe once a month- maybe 2 shots   OB History    No data available     Review of Systems  Constitutional: Positive for activity change. Negative for fever, chills, appetite change and fatigue.  HENT: Positive for congestion, ear pain, postnasal drip and rhinorrhea. Negative for facial swelling.   Eyes: Negative.   Respiratory: Positive for cough and shortness of breath.   Cardiovascular: Negative.   Musculoskeletal: Negative for neck pain and neck stiffness.  Skin: Negative for pallor and rash.  Neurological: Negative.     Allergies  Review of patient's allergies indicates no known allergies.  Home Medications   Prior to Admission medications   Medication Sig Start Date End Date Taking?  Authorizing Provider  albuterol (PROVENTIL HFA;VENTOLIN HFA) 108 (90 BASE) MCG/ACT inhaler Inhale 2 puffs into the lungs every 4 (four) hours as needed for wheezing or shortness of breath. 10/08/14   Hayden Rasmussenavid Iwalani Templeton, NP  amphetamine-dextroamphetamine (ADDERALL XR) 10 MG 24 hr capsule Take 10 mg by mouth daily.    Historical Provider, MD  HYDROcodone-acetaminophen (NORCO) 10-325 MG per tablet Take 1 tablet by mouth every 4 (four) hours as needed. pain    Historical Provider, MD  medroxyPROGESTERone (DEPO-PROVERA) 150 MG/ML injection Inject 150 mg into the muscle every 3 (three) months.    Historical Provider, MD  nitrofurantoin, macrocrystal-monohydrate, (MACROBID) 100 MG capsule Take 1 capsule (100 mg total) by mouth 2 (two) times daily. X 7 days 08/04/12   Donnetta HutchingBrian Cook, MD  oxyCODONE-acetaminophen (PERCOCET/ROXICET) 5-325 MG per tablet Take 1-2 tablets by mouth every 6 (six) hours as needed for pain. 08/04/12   Donnetta HutchingBrian Cook, MD  predniSONE (DELTASONE) 20 MG tablet Take 3 tabs po on first day, 2 tabs second day, 2 tabs third day, 1 tab fourth day, 1 tab 5th day. Take with food. Start 10/09/14. 10/08/14   Hayden Rasmussenavid Deerica Waszak, NP  promethazine (PHENERGAN) 25 MG tablet Take 1 tablet (25 mg total) by mouth every 6 (six) hours as needed for nausea. 08/04/12   Donnetta HutchingBrian Cook, MD  valACYclovir (VALTREX) 1000 MG tablet Take 2,000 mg by mouth 2 (two) times daily as needed. For cold sores    Historical Provider, MD   BP 120/80 mmHg  Pulse 79  Temp(Src) 98.2 F (36.8 C) (Oral)  Resp 20  SpO2 96% Physical  Exam  Constitutional: She is oriented to person, place, and time. She appears well-developed and well-nourished. No distress.  HENT:  Mouth/Throat: No oropharyngeal exudate.  Bilateral TMs are normal Oropharynx with minor erythema and clear PND  Eyes: Conjunctivae and EOM are normal.  Neck: Normal range of motion. Neck supple.  Cardiovascular: Normal rate, regular rhythm and normal heart sounds.   Pulmonary/Chest: Effort  normal. She has wheezes.  Prolonged expiratory phase  Musculoskeletal: She exhibits no edema.  Lymphadenopathy:    She has no cervical adenopathy.  Neurological: She is alert and oriented to person, place, and time.  Skin: Skin is warm and dry.  Psychiatric: She has a normal mood and affect.  Nursing note and vitals reviewed.   ED Course  Procedures (including critical care time) Labs Review Labs Reviewed - No data to display  Imaging Review No results found.   MDM   1. URI (upper respiratory infection)   2. Bronchospasm   3. Tobacco abuse disorder    Much improved post Duoneb 5 mg. Wheezing abated, good air movement. Pt st feels better. Solumedrol 80 mg IM Albuterol HFA and prednisone taper. OTC URI meds Stop smoking.     Hayden Rasmussenavid Arraya Buck, NP 10/08/14 [phone]

## 2014-10-08 NOTE — ED Notes (Signed)
C/o cold sx onset 3 weeks Sx include: cough, BA, HA, bilateral ear ain, diarrhea, nauseas Taking OTC cold meds w/no relief.  Alert, no signs of acute distress.

## 2014-10-13 ENCOUNTER — Emergency Department (HOSPITAL_COMMUNITY)
Admission: EM | Admit: 2014-10-13 | Discharge: 2014-10-13 | Disposition: A | Discharge status: Home / Self Care | Payer: Managed Care, Other (non HMO) | Attending: Emergency Medicine | Admitting: Emergency Medicine

## 2014-10-13 ENCOUNTER — Emergency Department (HOSPITAL_COMMUNITY): Payer: Managed Care, Other (non HMO)

## 2014-10-13 ENCOUNTER — Encounter (HOSPITAL_COMMUNITY): Payer: Self-pay | Admitting: *Deleted

## 2014-10-13 DIAGNOSIS — R300 Dysuria: Secondary | ICD-10-CM | POA: Insufficient documentation

## 2014-10-13 DIAGNOSIS — Z79899 Other long term (current) drug therapy: Secondary | ICD-10-CM | POA: Diagnosis not present

## 2014-10-13 DIAGNOSIS — R05 Cough: Secondary | ICD-10-CM | POA: Diagnosis present

## 2014-10-13 DIAGNOSIS — J209 Acute bronchitis, unspecified: Secondary | ICD-10-CM | POA: Insufficient documentation

## 2014-10-13 DIAGNOSIS — Z8669 Personal history of other diseases of the nervous system and sense organs: Secondary | ICD-10-CM | POA: Insufficient documentation

## 2014-10-13 DIAGNOSIS — Z3202 Encounter for pregnancy test, result negative: Secondary | ICD-10-CM | POA: Diagnosis not present

## 2014-10-13 DIAGNOSIS — Z720 Tobacco use: Secondary | ICD-10-CM | POA: Insufficient documentation

## 2014-10-13 DIAGNOSIS — R059 Cough, unspecified: Secondary | ICD-10-CM

## 2014-10-13 DIAGNOSIS — J40 Bronchitis, not specified as acute or chronic: Secondary | ICD-10-CM

## 2014-10-13 LAB — CBC WITH DIFFERENTIAL/PLATELET
Basophils Absolute: 0 10*3/uL (ref 0.0–0.1)
Basophils Relative: 0 % (ref 0–1)
Eosinophils Absolute: 0 10*3/uL (ref 0.0–0.7)
Eosinophils Relative: 0 % (ref 0–5)
HCT: 39 % (ref 36.0–46.0)
Hemoglobin: 12.9 g/dL (ref 12.0–15.0)
Lymphocytes Relative: 11 % — ABNORMAL LOW (ref 12–46)
Lymphs Abs: 2.4 10*3/uL (ref 0.7–4.0)
MCH: 29.1 pg (ref 26.0–34.0)
MCHC: 33.1 g/dL (ref 30.0–36.0)
MCV: 88 fL (ref 78.0–100.0)
Monocytes Absolute: 1.1 10*3/uL — ABNORMAL HIGH (ref 0.1–1.0)
Monocytes Relative: 5 % (ref 3–12)
Neutro Abs: 18.4 10*3/uL — ABNORMAL HIGH (ref 1.7–7.7)
Neutrophils Relative %: 84 % — ABNORMAL HIGH (ref 43–77)
Platelets: 321 10*3/uL (ref 150–400)
RBC: 4.43 MIL/uL (ref 3.87–5.11)
RDW: 13 % (ref 11.5–15.5)
WBC: 21.9 10*3/uL — ABNORMAL HIGH (ref 4.0–10.5)

## 2014-10-13 LAB — COMPREHENSIVE METABOLIC PANEL
ALT: 19 U/L (ref 0–35)
AST: 15 U/L (ref 0–37)
Albumin: 3.7 g/dL (ref 3.5–5.2)
Alkaline Phosphatase: 101 U/L (ref 39–117)
Anion gap: 17 — ABNORMAL HIGH (ref 5–15)
BUN: 5 mg/dL — ABNORMAL LOW (ref 6–23)
CO2: 20 mEq/L (ref 19–32)
Calcium: 8.8 mg/dL (ref 8.4–10.5)
Chloride: 101 mEq/L (ref 96–112)
Creatinine, Ser: 0.69 mg/dL (ref 0.50–1.10)
GFR calc Af Amer: >90 mL/min (ref >90)
GFR calc non Af Amer: >90 mL/min (ref >90)
Glucose, Bld: 129 mg/dL — ABNORMAL HIGH (ref 70–99)
Potassium: 3.3 mEq/L — ABNORMAL LOW (ref 3.7–5.3)
Sodium: 138 mEq/L (ref 137–147)
Total Bilirubin: 0.6 mg/dL (ref 0.3–1.2)
Total Protein: 6.7 g/dL (ref 6.0–8.3)

## 2014-10-13 LAB — TROPONIN I: Troponin I: <0.3 ng/mL (ref <0.30)

## 2014-10-13 LAB — URINALYSIS, ROUTINE W REFLEX MICROSCOPIC
Bilirubin Urine: NEGATIVE
Glucose, UA: NEGATIVE mg/dL
Hgb urine dipstick: NEGATIVE
Ketones, ur: NEGATIVE mg/dL
Leukocytes, UA: NEGATIVE
Nitrite: NEGATIVE
Protein, ur: NEGATIVE mg/dL
Specific Gravity, Urine: 1.023 (ref 1.005–1.030)
Urobilinogen, UA: 1 mg/dL (ref 0.0–1.0)
pH: 6 (ref 5.0–8.0)

## 2014-10-13 LAB — RAPID STREP SCREEN (MED CTR MEBANE ONLY): Streptococcus, Group A Screen (Direct): NEGATIVE

## 2014-10-13 LAB — PREGNANCY, URINE: Preg Test, Ur: NEGATIVE

## 2014-10-13 LAB — D-DIMER, QUANTITATIVE: D-Dimer, Quant: <0.27 ug/mL-FEU (ref 0.00–0.48)

## 2014-10-13 MED ORDER — IPRATROPIUM-ALBUTEROL 0.5-2.5 (3) MG/3ML IN SOLN
3.0000 mL | Freq: Once | RESPIRATORY_TRACT | Status: AC
  Administered 2014-10-13: 3 mL via RESPIRATORY_TRACT
  Filled 2014-10-13: qty 3

## 2014-10-13 MED ORDER — ALBUTEROL SULFATE HFA 108 (90 BASE) MCG/ACT IN AERS: 1.0000 | INHALATION_SPRAY | Freq: Four times a day (QID) | RESPIRATORY_TRACT | Status: DC | PRN

## 2014-10-13 MED ORDER — AZITHROMYCIN 250 MG PO TABS: 250.0000 mg | ORAL_TABLET | Freq: Every day | ORAL | Status: DC

## 2014-10-13 NOTE — ED Provider Notes (Signed)
CSN: [id number]     Arrival date & time 10/13/14  1230 History   First MD Initiated Contact with Patient 10/13/14 1300     Chief Complaint  Patient presents with  . Cough  . Dysuria     (Consider location/radiation/quality/duration/timing/severity/associated sxs/prior Treatment) HPI Comments: Patient presented with a three week history of cough, congestion, runny nose, sore throat and left-sided rib pain with coughing. She is a smoker but denies any history of asthma or COPD. She was seen in urgent care 5 days ago and treated for an upper respiratory infection steroids She is on her last dose of steroids today. She reports chest soreness with coughing Tmax 100 at home. Denies any abdominal pain, nausea or vomiting. Endorses sick contacts at home. She did not receive a flu shot.   Patient is a 23 y.o. female presenting with cough and dysuria. The history is provided by the patient and the EMS personnel. The history is limited by the condition of the patient.  Cough Associated symptoms: fever, myalgias and rhinorrhea   Associated symptoms: no chest pain and no headaches   Dysuria Associated symptoms: fever   Associated symptoms: no abdominal pain, no nausea, no vaginal discharge and no vomiting     Past Medical History  Diagnosis Date  . ADHD (attention deficit hyperactivity disorder)    Past Surgical History  Procedure Laterality Date  . No past surgeries    . Multiple extractions with alveoloplasty  06/28/2012    Procedure: MULTIPLE EXTRACION WITH ALVEOLOPLASTY;  Surgeon: Georgia LopesScott M Jensen, DDS;  Location: [facility] OR;  Service: Oral Surgery;  Laterality: Bilateral;  extract teeth numbers 2,3,4,5,6,7,8,9,10,11,13,14,19,20,22,23,24,26,27 with alveoplasty all quadrants    History reviewed. No pertinent family history. History  Substance Use Topics  . Smoking status: Current Every Day Smoker -- 1.00 packs/day for 7 years    Types: Cigarettes  . Smokeless tobacco: Not on file  . Alcohol  Use: Yes     Comment: maybe once a month- maybe 2 shots   OB History    No data available     Review of Systems  Constitutional: Positive for fever, activity change, appetite change and fatigue.  HENT: Positive for congestion and rhinorrhea.   Eyes: Negative for visual disturbance.  Respiratory: Positive for cough and chest tightness.   Cardiovascular: Negative for chest pain.  Gastrointestinal: Negative for nausea, vomiting and abdominal pain.  Endocrine: Negative for polyphagia.  Genitourinary: Positive for dysuria. Negative for vaginal bleeding and vaginal discharge.  Musculoskeletal: Positive for myalgias and arthralgias. Negative for neck pain and neck stiffness.  Neurological: Negative for dizziness, weakness and headaches.  A complete 10 system review of systems was obtained and all systems are negative except as noted in the HPI and PMH.      Allergies  Review of patient's allergies indicates no known allergies.  Home Medications   Prior to Admission medications   Medication Sig Start Date End Date Taking? Authorizing Provider  Aspirin-Acetaminophen (GOODY BODY PAIN) 500-325 MG PACK Take 1 Package by mouth daily as needed (headache).   Yes Historical Provider, MD  B Complex Vitamins (B COMPLEX-B12 PO) Take 1 tablet by mouth daily.   Yes Historical Provider, MD  Bioflavonoid Products (VITAMIN C) CHEW Chew 2 tablets by mouth daily.   Yes Historical Provider, MD  etonogestrel (IMPLANON) 68 MG IMPL implant 1 each by Subdermal route once.   Yes Historical Provider, MD  oxyCODONE-acetaminophen (PERCOCET/ROXICET) 5-325 MG per tablet Take 1-2 tablets by mouth every 6 (  six) hours as needed for pain. 08/04/12  Yes Donnetta Hutching, MD  predniSONE (DELTASONE) 20 MG tablet Take 3 tabs po on first day, 2 tabs second day, 2 tabs third day, 1 tab fourth day, 1 tab 5th day. Take with food. Start 10/09/14. 10/08/14  Yes Hayden Rasmussen, NP  valACYclovir (VALTREX) 1000 MG tablet Take 2,000 mg by mouth  2 (two) times daily as needed. For cold sores   Yes Historical Provider, MD  albuterol (PROVENTIL HFA;VENTOLIN HFA) 108 (90 BASE) MCG/ACT inhaler Inhale 1-2 puffs into the lungs every 6 (six) hours as needed for wheezing or shortness of breath. 10/13/14   Glynn Octave, MD  azithromycin (ZITHROMAX) 250 MG tablet Take 1 tablet (250 mg total) by mouth daily. Take first 2 tablets together, then 1 every day until finished. 10/13/14   Glynn Octave, MD  nitrofurantoin, macrocrystal-monohydrate, (MACROBID) 100 MG capsule Take 1 capsule (100 mg total) by mouth 2 (two) times daily. X 7 days Patient not taking: Reported on 10/13/2014 08/04/12   Donnetta Hutching, MD  promethazine (PHENERGAN) 25 MG tablet Take 1 tablet (25 mg total) by mouth every 6 (six) hours as needed for nausea. Patient not taking: Reported on 10/13/2014 08/04/12   Donnetta Hutching, MD   BP 140/87 mmHg  Pulse 88  Temp(Src) 98.1 F (36.7 C) (Oral)  Resp 18  SpO2 98% Physical Exam  Constitutional: She is oriented to person, place, and time. She appears well-developed and well-nourished. No distress.  HENT:  Head: Normocephalic and atraumatic.  Mouth/Throat: Oropharynx is clear and moist. No oropharyngeal exudate.  Hoarse voice  Eyes: Conjunctivae and EOM are normal. Pupils are equal, round, and reactive to light.  Neck: Normal range of motion. Neck supple.  No meningismus.  Cardiovascular: Normal rate, regular rhythm, normal heart sounds and intact distal pulses.   No murmur heard. Pulmonary/Chest: No respiratory distress. She has wheezes. She exhibits tenderness.  TTP L lower ribs.  No ecchymosis Prolonged expiratory phase  Abdominal: Soft. There is no tenderness. There is no rebound and no guarding.  Musculoskeletal: Normal range of motion. She exhibits no edema or tenderness.  Neurological: She is alert and oriented to person, place, and time. No cranial nerve deficit. She exhibits normal muscle tone. Coordination normal.  No ataxia  on finger to nose bilaterally. No pronator drift. 5/5 strength throughout. CN 2-12 intact. Negative Romberg. Equal grip strength. Sensation intact. Gait is normal.   Skin: Skin is warm.  Psychiatric: She has a normal mood and affect. Her behavior is normal.  Nursing note and vitals reviewed.   ED Course  Procedures (including critical care time) Labs Review Labs Reviewed  CBC WITH DIFFERENTIAL - Abnormal; Notable for the following:    WBC 21.9 (*)    Neutrophils Relative % 84 (*)    Neutro Abs 18.4 (*)    Lymphocytes Relative 11 (*)    Monocytes Absolute 1.1 (*)    All other components within normal limits  COMPREHENSIVE METABOLIC PANEL - Abnormal; Notable for the following:    Potassium 3.3 (*)    Glucose, Bld 129 (*)    BUN 5 (*)    Anion gap 17 (*)    All other components within normal limits  RAPID STREP SCREEN  CULTURE, GROUP A STREP  URINALYSIS, ROUTINE W REFLEX MICROSCOPIC  PREGNANCY, URINE  TROPONIN I  D-DIMER, QUANTITATIVE    Imaging Review Dg Chest 2 View  10/13/2014   CLINICAL DATA:  Cough and difficulty breathing; pain under left  breast for 2 weeks  EXAM: CHEST  2 VIEW  COMPARISON:  None.  FINDINGS: Lungs are clear. Heart size and pulmonary vascularity are normal. No adenopathy. No pneumothorax. No bone lesions.  IMPRESSION: No edema or consolidation.   Electronically Signed   By: Bretta BangWilliam  Woodruff M.D.   On: 10/13/2014 14:23     EKG Interpretation   Date/Time:  Monday October 13 2014 15:47:34 EST Ventricular Rate:  88 PR Interval:  113 QRS Duration: 97 QT Interval:  362 QTC Calculation: 438 R Axis:   81 Text Interpretation:  Sinus rhythm Borderline short PR interval Borderline  Q waves in lateral leads Baseline wander in lead(s) II III aVF No previous  ECGs available Confirmed by Eryx Zane  MD, Jalysa Swopes [phone]([id number]) on 10/13/2014  3:52:23 PM      MDM   Final diagnoses:  Cough  Bronchitis   Patient with 3 week history of cough, congestion, rib pain  with coughing. No fever. Seen in urgent care and given prednisone for upper respiratory infection.  Chest x ray negative. Leukocytosis of 22 likely due to recent steroid use D-dimer is negative. Troponin negative.  Given length of illness, will treat for bronchitis.  Smoking cessation discussed.  Refill bronchodilators.  No evidence of PE, PTX, or pneumonia.  BP 140/87 mmHg  Pulse 88  Temp(Src) 98.1 F (36.7 C) (Oral)  Resp 18  SpO2 98%   Glynn OctaveStephen Ranjit Ashurst, MD 10/13/14 [phone]

## 2014-10-13 NOTE — ED Notes (Signed)
Pt reports cough/cold symptoms x2 weeks. With back and rib pain 7/10. Reports dysuria and vaginal odor. Pt went to urgent care on Wednesday.

## 2014-10-13 NOTE — Discharge Instructions (Signed)

## 2014-10-16 LAB — CULTURE, GROUP A STREP

## 2015-11-24 ENCOUNTER — Telehealth: Payer: Self-pay

## 2015-11-24 NOTE — Telephone Encounter (Signed)
CALLED PATIENT TO SCHEDULE A PRENATAL APPT - HAVE REFERRAL FROM Arther Abbott, MD OFFICE - LEFT MESSAGE FOR HER TO CALL us

## 2015-11-27 ENCOUNTER — Encounter: Payer: Medicaid Other | Admitting: Certified Nurse Midwife

## 2015-12-04 ENCOUNTER — Encounter: Payer: Self-pay | Admitting: Certified Nurse Midwife

## 2015-12-04 ENCOUNTER — Ambulatory Visit (INDEPENDENT_AMBULATORY_CARE_PROVIDER_SITE_OTHER): Payer: Medicaid Other | Admitting: Certified Nurse Midwife

## 2015-12-04 VITALS — BP 130/32 | HR 105 | Temp 98.8°F | Wt 179.0 lb

## 2015-12-04 DIAGNOSIS — K5900 Constipation, unspecified: Secondary | ICD-10-CM | POA: Diagnosis not present

## 2015-12-04 DIAGNOSIS — Z716 Tobacco abuse counseling: Secondary | ICD-10-CM | POA: Diagnosis not present

## 2015-12-04 DIAGNOSIS — Z720 Tobacco use: Secondary | ICD-10-CM | POA: Diagnosis not present

## 2015-12-04 DIAGNOSIS — O99612 Diseases of the digestive system complicating pregnancy, second trimester: Secondary | ICD-10-CM | POA: Diagnosis not present

## 2015-12-04 DIAGNOSIS — Z23 Encounter for immunization: Secondary | ICD-10-CM | POA: Diagnosis not present

## 2015-12-04 DIAGNOSIS — Z98891 History of uterine scar from previous surgery: Secondary | ICD-10-CM | POA: Insufficient documentation

## 2015-12-04 DIAGNOSIS — Z3402 Encounter for supervision of normal first pregnancy, second trimester: Secondary | ICD-10-CM | POA: Diagnosis not present

## 2015-12-04 LAB — POCT URINALYSIS DIPSTICK
Bilirubin, UA: NEGATIVE
Blood, UA: NEGATIVE
Glucose, UA: NEGATIVE
Ketones, UA: NEGATIVE
Leukocytes, UA: NEGATIVE
Nitrite, UA: NEGATIVE
Protein, UA: NEGATIVE
Spec Grav, UA: 1.015
Urobilinogen, UA: NEGATIVE
pH, UA: 5

## 2015-12-04 MED ORDER — VARENICLINE TARTRATE 0.5 MG X 11 & 1 MG X 42 PO MISC: ORAL | Status: DC

## 2015-12-04 MED ORDER — VARENICLINE TARTRATE 1 MG PO TABS: 1.0000 mg | ORAL_TABLET | Freq: Two times a day (BID) | ORAL | Status: DC

## 2015-12-04 MED ORDER — VITAFOL GUMMIES 3.33-0.333-34.8 MG PO CHEW: 3.0000 | CHEWABLE_TABLET | Freq: Every day | ORAL | Status: DC

## 2015-12-04 MED ORDER — DOCUSATE SODIUM 100 MG PO CAPS: 100.0000 mg | ORAL_CAPSULE | Freq: Two times a day (BID) | ORAL | Status: DC

## 2015-12-04 NOTE — Progress Notes (Signed)
Subjective:    [name] is being seen today for her first obstetrical visit.  This is not a planned pregnancy. She is at [redacted]w[redacted]d gestation. Her obstetrical history is significant for obesity, smoker and 1/2 pack per day.  Reports hard stools, roughly 2 x/wk.  Relationship with FOB: spouse, living together. Patient does intend to breast feed. Pregnancy history fully reviewed.  The information documented in the HPI was reviewed and verified.  Menstrual History: OB History    Gravida Para Term Preterm AB TAB SAB Ectopic Multiple Living   1               Menarche age: 64-39 years of age.   Patient's last menstrual period was 08/19/2015 (approximate).    Past Medical History  Diagnosis Date  . ADHD (attention deficit hyperactivity disorder)     Past Surgical History  Procedure Laterality Date  . No past surgeries    . Multiple extractions with alveoloplasty  06/28/2012    Procedure: MULTIPLE EXTRACION WITH ALVEOLOPLASTY;  Surgeon: Georgia Lopes, DDS;  Location: [facility] OR;  Service: Oral Surgery;  Laterality: Bilateral;  extract teeth numbers 2,3,4,5,6,7,8,9,10,11,13,14,19,20,22,23,24,26,27 with alveoplasty all quadrants      (Not in a hospital admission) No Known Allergies  Social History  Substance Use Topics  . Smoking status: Current Every Day Smoker -- 1.00 packs/day for 7 years    Types: Cigarettes  . Smokeless tobacco: Never Used  . Alcohol Use: No    History reviewed. No pertinent family history.   Review of Systems Constitutional: negative for weight loss Gastrointestinal: negative for vomiting Genitourinary:negative for genital lesions and vaginal discharge and dysuria Musculoskeletal:negative for back pain Behavioral/Psych: negative for abusive relationship, depression, illegal drug usage and tobacco use    Objective:    BP 130/32 mmHg  Pulse 105  Temp(Src) 98.8 F (37.1 C)  Wt 179 lb (81.194 kg)  LMP 08/19/2015 (Approximate) General Appearance:    Alert,  cooperative, no distress, appears stated age  Head:    Normocephalic, without obvious abnormality, atraumatic  Eyes:    PERRL, conjunctiva/corneas clear, EOM's intact, fundi    benign, both eyes  Ears:    Normal TM's and external ear canals, both ears  Nose:   Nares normal, septum midline, mucosa normal, no drainage    or sinus tenderness  Throat:   Lips, mucosa, and tongue normal; teeth and gums normal  Neck:   Supple, symmetrical, trachea midline, no adenopathy;    thyroid:  no enlargement/tenderness/nodules; no carotid   bruit or JVD  Back:     Symmetric, no curvature, ROM normal, no CVA tenderness  Lungs:     Clear to auscultation bilaterally, respirations unlabored  Chest Wall:    No tenderness or deformity   Heart:    Regular rate and rhythm, S1 and S2 normal, no murmur, rub   or gallop  Breast Exam:    No tenderness, masses, or nipple abnormality  Abdomen:     Soft, non-tender, bowel sounds active all four quadrants,    no masses, no organomegaly  Genitalia:    Normal female without lesion, discharge or tenderness  Extremities:   Extremities normal, atraumatic, no cyanosis or edema  Pulses:   2+ and symmetric all extremities  Skin:   Skin color, texture, turgor normal, no rashes or lesions  Lymph nodes:   Cervical, supraclavicular, and axillary nodes normal  Neurologic:   CNII-XII intact, normal strength, sensation and reflexes    throughout  Cervix:  Long, thick, closed and posterior.  FH: less than U  FHR: 150's   Lab Review Urine pregnancy test Labs reviewed yes Radiologic studies reviewed no Assessment:    Pregnancy at [redacted]w[redacted]d weeks   Tobacco abuse in pregnancy   Smoking cessation counseling  constipation  Plan:      Prenatal vitamins.  Counseling provided regarding continued use of seat belts, cessation of alcohol consumption, smoking or use of illicit drugs; infection precautions i.e., influenza/TDAP immunizations, toxoplasmosis,CMV, parvovirus, listeria and  varicella; workplace safety, exercise during pregnancy; routine dental care, safe medications, sexual activity, hot tubs, saunas, pools, travel, caffeine use, fish and methlymercury, potential toxins, hair treatments, varicose veins Weight gain recommendations per IOM guidelines reviewed: underweight/BMI< 18.5--> gain 28 - 40 lbs; normal weight/BMI 18.5 - 24.9--> gain 25 - 35 lbs; overweight/BMI 25 - 29.9--> gain 15 - 25 lbs; obese/BMI >30->gain  11 - 20 lbs Problem list reviewed and updated. FIRST/CF mutation testing/NIPT/QUAD SCREEN/fragile X/Ashkenazi Jewish population testing/Spinal muscular atrophy discussed: requested. Role of ultrasound in pregnancy discussed; fetal survey: requested. Amniocentesis discussed: not indicated. VBAC calculator score: VBAC consent form provided Meds ordered this encounter  Medications  . acetaminophen (TYLENOL) 325 MG tablet    Sig: Take 650 mg by mouth every 6 (six) hours as needed.  . Prenatal Vit-Fe Phos-FA-Omega (VITAFOL GUMMIES) 3.33-0.333-34.8 MG CHEW    Sig: Chew 3 tablets by mouth daily.    Dispense:  90 tablet    Refill:  12  . varenicline (CHANTIX STARTING MONTH PAK) 0.5 MG X 11 & 1 MG X 42 tablet    Sig: Take a 0.5mg  tablet 1X/day for 3 days, increase to twice a day for 4 days, then take 1 mg tablet twice a day.    Dispense:  53 tablet    Refill:  0  . varenicline (CHANTIX CONTINUING MONTH PAK) 1 MG tablet    Sig: Take 1 tablet (1 mg total) by mouth 2 (two) times daily.    Dispense:  60 tablet    Refill:  5  . docusate sodium (COLACE) 100 MG capsule    Sig: Take 1 capsule (100 mg total) by mouth 2 (two) times daily.    Dispense:  120 capsule    Refill:  PRN   Orders Placed This Encounter  Procedures  . Culture, OB Urine  . SureSwab, Vaginosis/Vaginitis Plus  . [place] MFM OB COMP + 14 WK    Standing Status: Future     Number of Occurrences:      Standing Expiration Date: 01/31/2017    Order Specific Question:  Reason for Exam (SYMPTOM  OR  DIAGNOSIS REQUIRED)    Answer:  fetal anatomy scan    Order Specific Question:  Preferred imaging location?    Answer:  [facility]Ul[facility]  . Flu Vaccine QUAD 36+ mos IM  . Obstetric panel  . HIV antibody  . Varicella zoster antibody, IgG  . VITAMIN D 25 Hydroxy (Vit-D Deficiency, Fractures)  . Drug Screen, Urine  . AFP, Quad Screen    Order Specific Question:  Repeat Sample    Answer:  No    Order Specific Question:  Maternal Race    Answer:  White    Order Specific Question:  EDD    Answer:  05/25/2016    Order Specific Question:  Pregnancy Donor Egg (Y/N)    Answer:  No    Order Specific Question:  Gest Age at U/S (Wk.Dy)    Answer:  n/a  Order Specific Question:  Number of Fetuses    Answer:  1    Order Specific Question:  Hx of OSB/NTD?    Answer:  No    Order Specific Question:  History of Down Syndrome?    Answer:  No    Order Specific Question:  Maternal IDDM (insulin-dependent diabetes mellitus)    Answer:  No    Order Specific Question:  Maternal Weight (lbs)    Answer:  179  . POCT urinalysis dipstick    Follow up in 4 weeks. 50% of 30 min visit spent on counseling and coordination of care.

## 2015-12-05 LAB — DRUG SCREEN, URINE
Amphetamine Screen, Ur: NEGATIVE
Barbiturate Quant, Ur: NEGATIVE
Benzodiazepines.: NEGATIVE
Cocaine Metabolites: NEGATIVE
Creatinine,U: 97.03 mg/dL
Marijuana Metabolite: POSITIVE — AB
Methadone: NEGATIVE
Opiates: NEGATIVE
Phencyclidine (PCP): NEGATIVE
Propoxyphene: NEGATIVE

## 2015-12-05 LAB — CULTURE, OB URINE
Colony Count: NO GROWTH
Organism ID, Bacteria: NO GROWTH

## 2015-12-05 LAB — HIV ANTIBODY (ROUTINE TESTING W REFLEX): HIV 1&2 Ab, 4th Generation: NONREACTIVE

## 2015-12-05 LAB — VITAMIN D 25 HYDROXY (VIT D DEFICIENCY, FRACTURES): Vit D, 25-Hydroxy: 30 ng/mL (ref 30–100)

## 2015-12-07 ENCOUNTER — Other Ambulatory Visit: Payer: Self-pay | Admitting: Certified Nurse Midwife

## 2015-12-07 LAB — AFP, QUAD SCREEN
AFP: 42.1 ng/mL
Age Alone: 1:1080 {titer}
Curr Gest Age: 15.2 wks.days
Down Syndrome Scr Risk Est: 1:38500 {titer}
HCG, Total: 13.85 IU/mL
INH: 123.5 pg/mL
Interpretation-AFP: NEGATIVE
MoM for AFP: 1.53
MoM for INH: 0.79
MoM for hCG: 0.33
Open Spina bifida: NEGATIVE
Osb Risk: 1:2540 {titer}
Tri 18 Scr Risk Est: NEGATIVE
Trisomy 18 (Edward) Syndrome Interp.: 1:567 {titer}
uE3 Mom: 0.64
uE3 Value: 0.4 ng/mL

## 2015-12-07 LAB — OBSTETRIC PANEL
Antibody Screen: NEGATIVE
Basophils Absolute: 0 10*3/uL (ref 0.0–0.1)
Basophils Relative: 0 % (ref 0–1)
Eosinophils Absolute: 0.1 10*3/uL (ref 0.0–0.7)
Eosinophils Relative: 1 % (ref 0–5)
HCT: 37.2 % (ref 36.0–46.0)
Hemoglobin: 12.3 g/dL (ref 12.0–15.0)
Hepatitis B Surface Ag: NEGATIVE
Lymphocytes Relative: 16 % (ref 12–46)
Lymphs Abs: 2.3 10*3/uL (ref 0.7–4.0)
MCH: 29.1 pg (ref 26.0–34.0)
MCHC: 33.1 g/dL (ref 30.0–36.0)
MCV: 87.9 fL (ref 78.0–100.0)
MPV: 9.9 fL (ref 8.6–12.4)
Monocytes Absolute: 0.7 10*3/uL (ref 0.1–1.0)
Monocytes Relative: 5 % (ref 3–12)
Neutro Abs: 11.4 10*3/uL — ABNORMAL HIGH (ref 1.7–7.7)
Neutrophils Relative %: 78 % — ABNORMAL HIGH (ref 43–77)
Platelets: 352 10*3/uL (ref 150–400)
RBC: 4.23 MIL/uL (ref 3.87–5.11)
RDW: 13.9 % (ref 11.5–15.5)
Rh Type: POSITIVE
Rubella: 2.16 Index — ABNORMAL HIGH (ref <0.90)
WBC: 14.6 10*3/uL — ABNORMAL HIGH (ref 4.0–10.5)

## 2015-12-07 LAB — PAP IG W/ RFLX HPV ASCU

## 2015-12-07 LAB — VARICELLA ZOSTER ANTIBODY, IGG: Varicella IgG: 176.4 Index — ABNORMAL HIGH (ref <135.00)

## 2015-12-08 ENCOUNTER — Other Ambulatory Visit: Payer: Self-pay | Admitting: Certified Nurse Midwife

## 2015-12-08 DIAGNOSIS — B373 Candidiasis of vulva and vagina: Secondary | ICD-10-CM

## 2015-12-08 DIAGNOSIS — B3731 Acute candidiasis of vulva and vagina: Secondary | ICD-10-CM

## 2015-12-08 LAB — SURESWAB, VAGINOSIS/VAGINITIS PLUS
Atopobium vaginae: NOT DETECTED Log (cells/mL)
C. albicans, DNA: DETECTED — AB
C. glabrata, DNA: NOT DETECTED
C. parapsilosis, DNA: NOT DETECTED
C. trachomatis RNA, TMA: NOT DETECTED
C. tropicalis, DNA: NOT DETECTED
Gardnerella vaginalis: NOT DETECTED Log (cells/mL)
LACTOBACILLUS SPECIES: 6.1 Log (cells/mL)
MEGASPHAERA SPECIES: NOT DETECTED Log (cells/mL)
N. gonorrhoeae RNA, TMA: NOT DETECTED
T. vaginalis RNA, QL TMA: NOT DETECTED

## 2015-12-08 MED ORDER — FLUCONAZOLE 100 MG PO TABS: 100.0000 mg | ORAL_TABLET | Freq: Once | ORAL | Status: DC

## 2015-12-18 ENCOUNTER — Encounter: Payer: Self-pay | Admitting: *Deleted

## 2016-01-01 ENCOUNTER — Other Ambulatory Visit: Payer: Self-pay | Admitting: Certified Nurse Midwife

## 2016-01-01 ENCOUNTER — Ambulatory Visit (INDEPENDENT_AMBULATORY_CARE_PROVIDER_SITE_OTHER): Payer: Medicaid Other | Admitting: Certified Nurse Midwife

## 2016-01-01 ENCOUNTER — Ambulatory Visit (HOSPITAL_COMMUNITY)
Admission: RE | Admit: 2016-01-01 | Discharge: 2016-01-01 | Disposition: A | Discharge status: Home / Self Care | Payer: Medicaid Other | Source: Ambulatory Visit | Attending: Certified Nurse Midwife | Admitting: Certified Nurse Midwife

## 2016-01-01 VITALS — BP 115/72 | HR 86 | Temp 98.1°F | Wt 186.0 lb

## 2016-01-01 DIAGNOSIS — O99332 Smoking (tobacco) complicating pregnancy, second trimester: Secondary | ICD-10-CM | POA: Diagnosis not present

## 2016-01-01 DIAGNOSIS — Z3402 Encounter for supervision of normal first pregnancy, second trimester: Secondary | ICD-10-CM

## 2016-01-01 DIAGNOSIS — Z36 Encounter for antenatal screening of mother: Secondary | ICD-10-CM | POA: Insufficient documentation

## 2016-01-01 DIAGNOSIS — Z3A19 19 weeks gestation of pregnancy: Secondary | ICD-10-CM | POA: Diagnosis not present

## 2016-01-01 LAB — POCT URINALYSIS DIPSTICK
Bilirubin, UA: NEGATIVE
Blood, UA: NEGATIVE
Glucose, UA: NEGATIVE
Ketones, UA: NEGATIVE
Leukocytes, UA: NEGATIVE
Nitrite, UA: NEGATIVE
Protein, UA: NEGATIVE
Spec Grav, UA: <=1.005
Urobilinogen, UA: NEGATIVE
pH, UA: 8.5

## 2016-01-01 NOTE — Progress Notes (Signed)
Subjective:    [name] is a 25 y.o. female being seen today for her obstetrical visit. She is at 3920w2d gestation. Patient reports: no complaints . Fetal movement: normal.  States she is decreasing her smoking, had one joint this week.  Encouragement given to quit.    Problem List Items Addressed This Visit    None    Visit Diagnoses    Encounter for supervision of normal first pregnancy in second trimester    -  Primary    Relevant Orders    POCT urinalysis dipstick      Patient Active Problem List   Diagnosis Date Noted  . Supervision of normal first pregnancy in second trimester 12/04/2015   Objective:    BP 115/72 mmHg  Pulse 86  Temp(Src) 98.1 F (36.7 C)  Wt 186 lb (84.369 kg)  LMP 08/19/2015 (Approximate) FHT: 143 BPM  Uterine Size: size equals dates and at U     Assessment:    Pregnancy @ 6320w2d    On chantix, doing well  Plan:    OBGCT: discussed. Signs and symptoms of preterm labor: discussed.  Labs, problem list reviewed and updated 2 hr GTT planned Follow up in 4 weeks with [name] to meet him.

## 2016-01-05 ENCOUNTER — Other Ambulatory Visit: Payer: Self-pay | Admitting: Certified Nurse Midwife

## 2016-01-21 IMAGING — CR DG CHEST 2V
2 series · 2 of 2 positions shown · non-contrast
Comparison: None.

CLINICAL DATA: Cough and difficulty breathing; pain under left
breast for 2 weeks

EXAM:
CHEST  2 VIEW

[w chest pa]
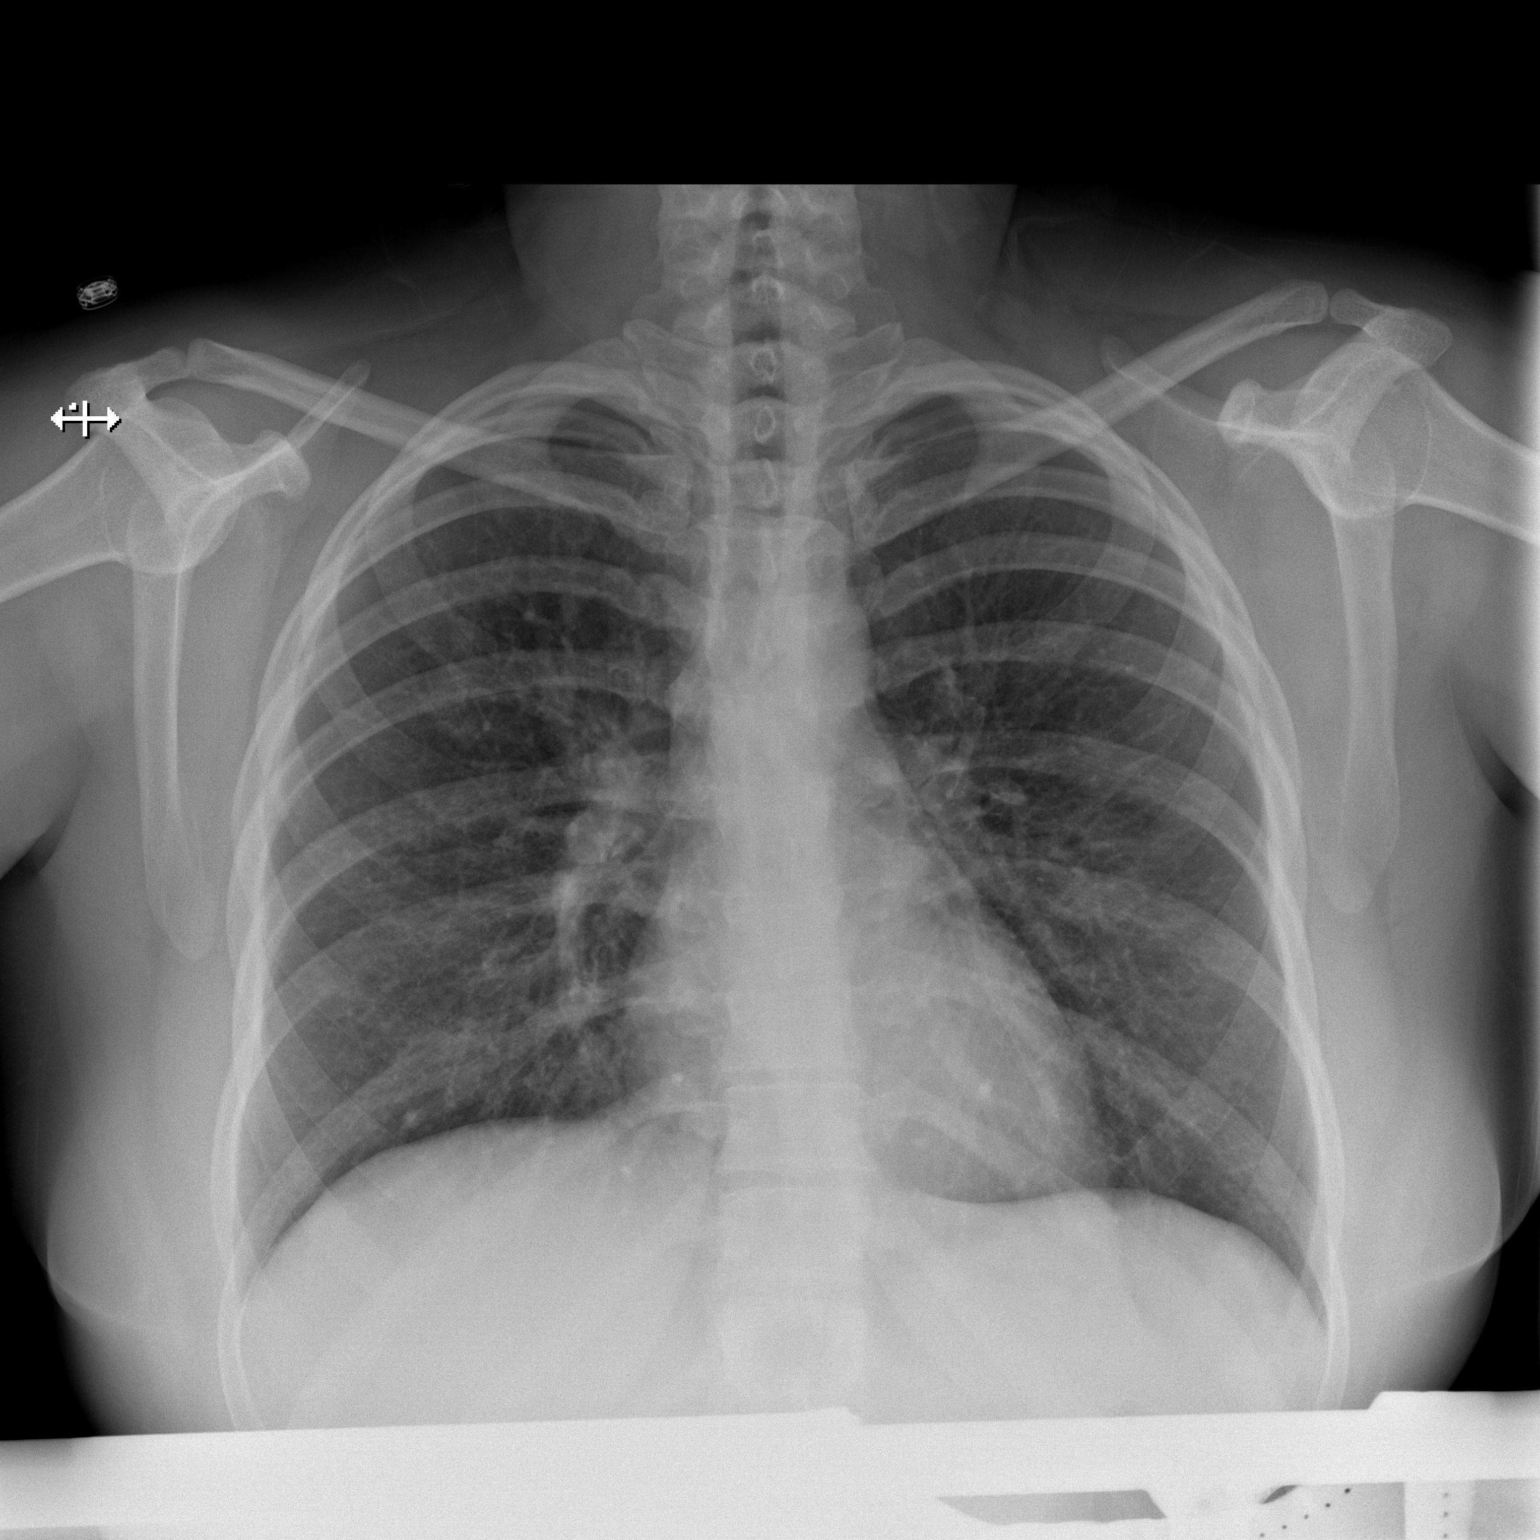

[w chest lat]
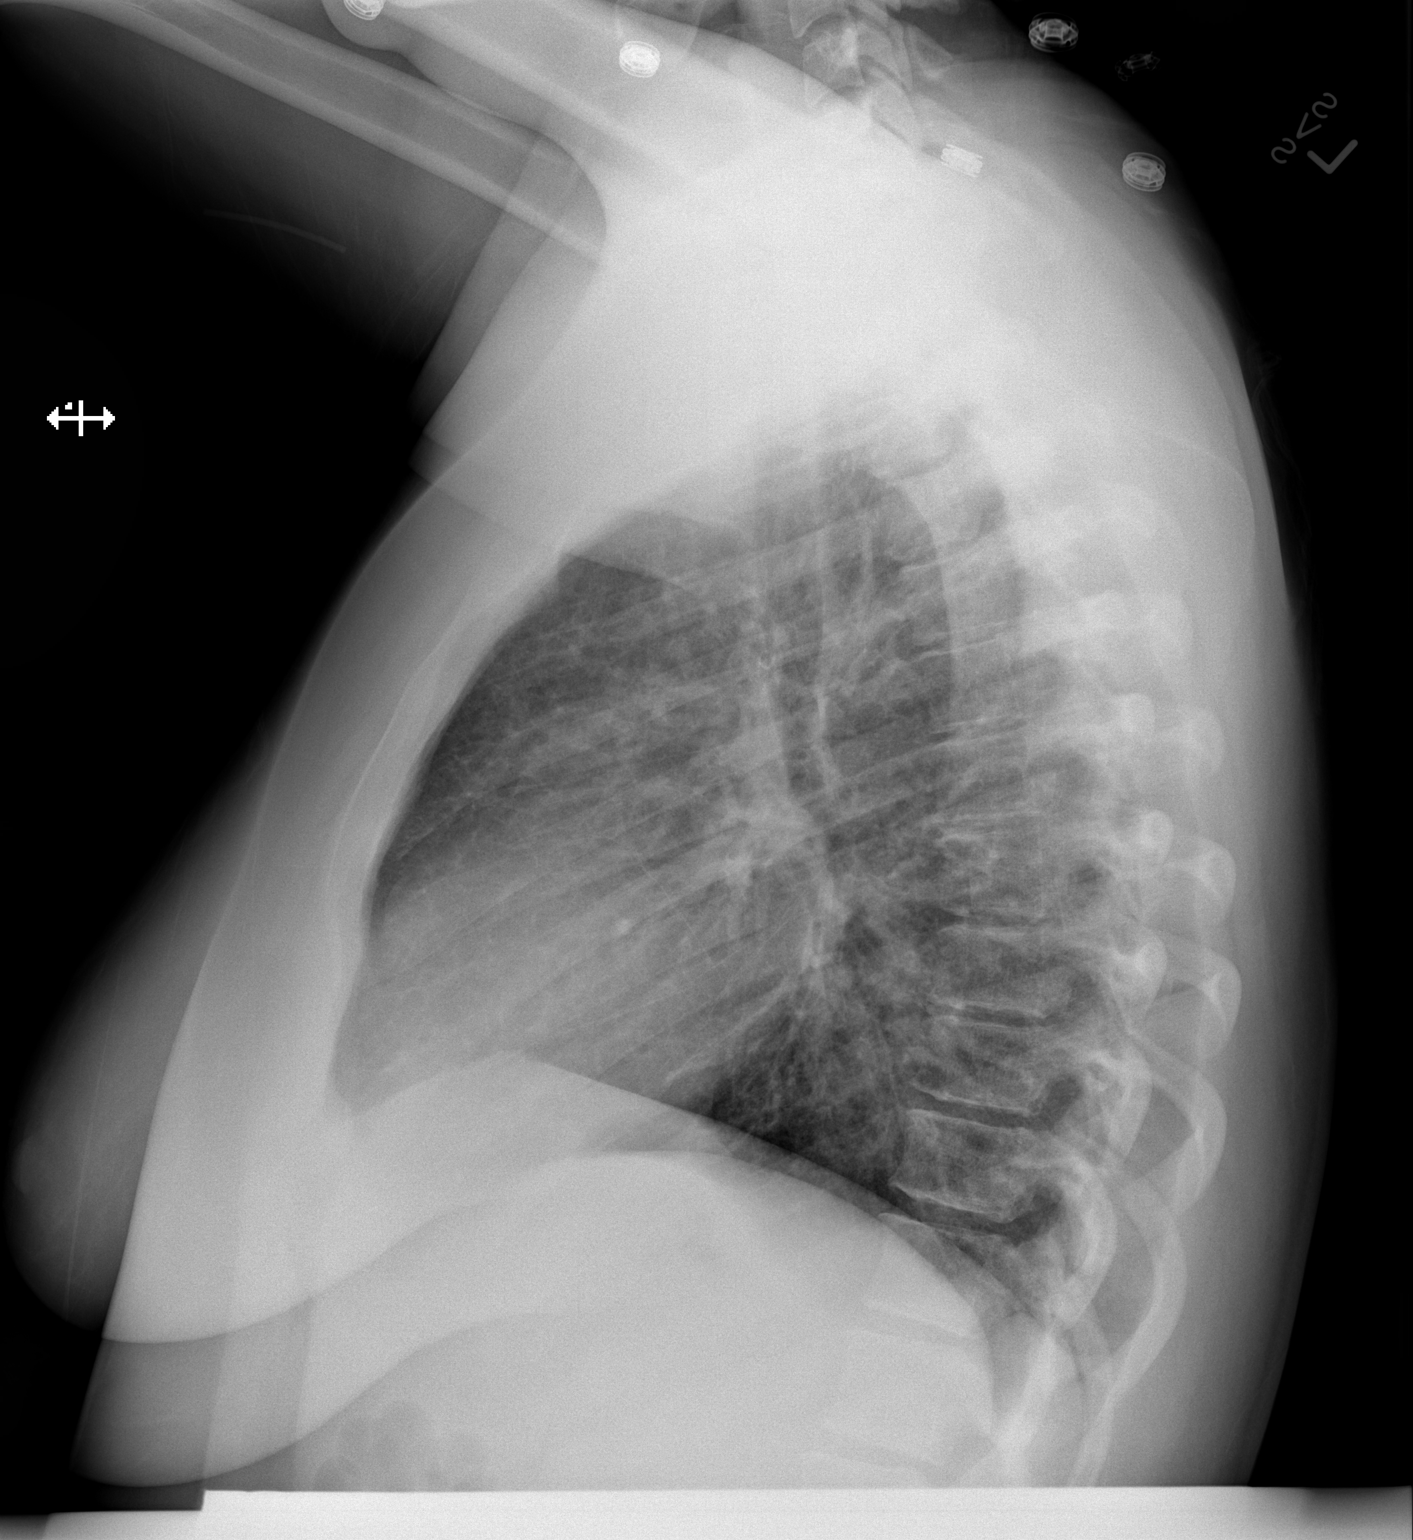

[2 of 2 positions shown; findings below may reference images not displayed]

FINDINGS: Lungs are clear. Heart size and pulmonary vascularity are normal. No
adenopathy. No pneumothorax. No bone lesions.
IMPRESSION: No edema or consolidation.

## 2016-01-28 ENCOUNTER — Ambulatory Visit (INDEPENDENT_AMBULATORY_CARE_PROVIDER_SITE_OTHER): Payer: Medicaid Other | Admitting: Obstetrics

## 2016-01-28 ENCOUNTER — Encounter: Payer: Self-pay | Admitting: Obstetrics

## 2016-01-28 VITALS — BP 116/74 | HR 76 | Temp 98.1°F | Wt 192.0 lb

## 2016-01-28 DIAGNOSIS — Z3402 Encounter for supervision of normal first pregnancy, second trimester: Secondary | ICD-10-CM

## 2016-01-28 LAB — POCT URINALYSIS DIPSTICK
Bilirubin, UA: NEGATIVE
Blood, UA: NEGATIVE
Glucose, UA: NEGATIVE
Ketones, UA: NEGATIVE
Leukocytes, UA: NEGATIVE
Nitrite, UA: NEGATIVE
Protein, UA: NEGATIVE
Spec Grav, UA: 1.015
Urobilinogen, UA: NEGATIVE
pH, UA: 6

## 2016-01-28 NOTE — Progress Notes (Signed)
Subjective:    [name] is a 25 y.o. female being seen today for her obstetrical visit. She is at 4085w1d gestation. Patient reports: no complaints . Fetal movement: normal.  Problem List Items Addressed This Visit    None    Visit Diagnoses    Encounter for supervision of normal first pregnancy in second trimester    -  Primary    Relevant Orders    POCT urinalysis dipstick (Completed)      Patient Active Problem List   Diagnosis Date Noted  . Supervision of normal first pregnancy in second trimester 12/04/2015   Objective:    BP 116/74 mmHg  Pulse 76  Temp(Src) 98.1 F (36.7 C)  Wt 192 lb (87.091 kg)  LMP 08/19/2015 (Approximate) FHT: 150 BPM  Uterine Size: size greater than dates     Assessment:    Pregnancy @ 9485w1d    Plan:    OBGCT: ordered for next visit. Signs and symptoms of preterm labor: discussed.  Labs, problem list reviewed and updated 2 hr GTT planned Follow up in 4 weeks.

## 2016-01-28 NOTE — Progress Notes (Signed)
Patient has no concerns today. 

## 2016-02-09 ENCOUNTER — Other Ambulatory Visit: Payer: Self-pay | Admitting: Certified Nurse Midwife

## 2016-02-24 ENCOUNTER — Ambulatory Visit (HOSPITAL_COMMUNITY)
Admission: RE | Admit: 2016-02-24 | Discharge: 2016-02-24 | Disposition: A | Discharge status: Home / Self Care | Payer: Commercial Managed Care - PPO | Source: Ambulatory Visit | Attending: Obstetrics | Admitting: Obstetrics

## 2016-02-24 DIAGNOSIS — Z3402 Encounter for supervision of normal first pregnancy, second trimester: Secondary | ICD-10-CM

## 2016-02-24 DIAGNOSIS — O99332 Smoking (tobacco) complicating pregnancy, second trimester: Secondary | ICD-10-CM | POA: Insufficient documentation

## 2016-02-24 DIAGNOSIS — Z3A27 27 weeks gestation of pregnancy: Secondary | ICD-10-CM | POA: Diagnosis not present

## 2016-02-24 DIAGNOSIS — Z36 Encounter for antenatal screening of mother: Secondary | ICD-10-CM | POA: Diagnosis not present

## 2016-02-25 ENCOUNTER — Encounter: Payer: Self-pay | Admitting: Obstetrics

## 2016-02-25 ENCOUNTER — Ambulatory Visit (INDEPENDENT_AMBULATORY_CARE_PROVIDER_SITE_OTHER): Payer: Medicaid Other | Admitting: Obstetrics

## 2016-02-25 ENCOUNTER — Other Ambulatory Visit: Payer: Medicaid Other

## 2016-02-25 VITALS — BP 122/81 | HR 93 | Wt 198.0 lb

## 2016-02-25 DIAGNOSIS — K219 Gastro-esophageal reflux disease without esophagitis: Secondary | ICD-10-CM

## 2016-02-25 DIAGNOSIS — Z720 Tobacco use: Secondary | ICD-10-CM

## 2016-02-25 DIAGNOSIS — J301 Allergic rhinitis due to pollen: Secondary | ICD-10-CM

## 2016-02-25 DIAGNOSIS — Z3402 Encounter for supervision of normal first pregnancy, second trimester: Secondary | ICD-10-CM

## 2016-02-25 DIAGNOSIS — Z716 Tobacco abuse counseling: Secondary | ICD-10-CM

## 2016-02-25 LAB — POCT URINALYSIS DIPSTICK
Bilirubin, UA: NEGATIVE
Blood, UA: NEGATIVE
Glucose, UA: NEGATIVE
Ketones, UA: NEGATIVE
Leukocytes, UA: NEGATIVE
Nitrite, UA: NEGATIVE
Protein, UA: NEGATIVE
Spec Grav, UA: 1.01
Urobilinogen, UA: NEGATIVE
pH, UA: 7

## 2016-02-25 MED ORDER — OMEPRAZOLE 20 MG PO CPDR
20.0000 mg | DELAYED_RELEASE_CAPSULE | Freq: Two times a day (BID) | ORAL | Status: DC
Start: 2016-02-25 — End: 2019-10-23

## 2016-02-25 MED ORDER — LORATADINE 10 MG PO TABS: 10.0000 mg | ORAL_TABLET | Freq: Every day | ORAL | Status: DC

## 2016-02-25 MED ORDER — VARENICLINE TARTRATE 0.5 MG X 11 & 1 MG X 42 PO MISC: ORAL | Status: DC

## 2016-02-25 NOTE — Progress Notes (Signed)
Subjective:    [name] is a 25 y.o. female being seen today for her obstetrical visit. She is at 3376w1d gestation. Patient reports: acid indigestion and seasonal allergies . Fetal movement: normal.  Problem List Items Addressed This Visit    None    Visit Diagnoses    Encounter for supervision of normal first pregnancy in second trimester    -  Primary    Relevant Orders    POCT urinalysis dipstick (Completed)    Glucose Tolerance, 2 Hours w/1 Hour    CBC    HIV antibody    RPR    Encounter for smoking cessation counseling        Relevant Medications    varenicline (CHANTIX STARTING MONTH PAK) 0.5 MG X 11 & 1 MG X 42 tablet    GERD without esophagitis        Relevant Medications    omeprazole (PRILOSEC) 20 MG capsule    Allergic rhinitis due to pollen        Relevant Medications    loratadine (CLARITIN) 10 MG tablet      Patient Active Problem List   Diagnosis Date Noted  . Supervision of normal first pregnancy in second trimester 12/04/2015   Objective:    BP 122/81 mmHg  Pulse 93  Wt 198 lb (89.812 kg)  LMP 08/19/2015 (Approximate) FHT: 150 BPM  Uterine Size: size equals dates     Assessment:    Pregnancy @ 2576w1d     GERD  Seasonal Allergies to Pollen      Plan:   Prilosec Rx Claritin Rx  OBGCT: ordered. Signs and symptoms of preterm labor: discussed.  Labs, problem list reviewed and updated 2 hr GTT planned Follow up in 2 weeks.

## 2016-02-26 LAB — CBC
Hematocrit: 37.7 % (ref 34.0–46.6)
Hemoglobin: 12.8 g/dL (ref 11.1–15.9)
MCH: 30.1 pg (ref 26.6–33.0)
MCHC: 34 g/dL (ref 31.5–35.7)
MCV: 89 fL (ref 79–97)
Platelets: 306 10*3/uL (ref 150–379)
RBC: 4.25 x10E6/uL (ref 3.77–5.28)
RDW: 13.2 % (ref 12.3–15.4)
WBC: 17.8 10*3/uL — ABNORMAL HIGH (ref 3.4–10.8)

## 2016-02-26 LAB — GLUCOSE TOLERANCE, 2 HOURS W/ 1HR
Glucose, 1 hour: 148 mg/dL (ref 65–179)
Glucose, 2 hour: 119 mg/dL (ref 65–152)
Glucose, Fasting: 82 mg/dL (ref 65–91)

## 2016-02-26 LAB — HIV ANTIBODY (ROUTINE TESTING W REFLEX): HIV Screen 4th Generation wRfx: NONREACTIVE

## 2016-02-26 LAB — RPR: RPR Ser Ql: NONREACTIVE

## 2016-03-10 ENCOUNTER — Encounter: Payer: Medicaid Other | Admitting: Obstetrics

## 2016-03-14 ENCOUNTER — Ambulatory Visit (INDEPENDENT_AMBULATORY_CARE_PROVIDER_SITE_OTHER): Payer: Medicaid Other | Admitting: Obstetrics

## 2016-03-14 VITALS — BP 122/75 | HR 96 | Temp 98.0°F | Wt 206.0 lb

## 2016-03-14 DIAGNOSIS — Z331 Pregnant state, incidental: Secondary | ICD-10-CM

## 2016-03-14 DIAGNOSIS — Z3493 Encounter for supervision of normal pregnancy, unspecified, third trimester: Secondary | ICD-10-CM

## 2016-03-14 DIAGNOSIS — Z1389 Encounter for screening for other disorder: Secondary | ICD-10-CM

## 2016-03-15 ENCOUNTER — Encounter: Payer: Self-pay | Admitting: Obstetrics

## 2016-03-15 NOTE — Progress Notes (Signed)
Subjective:    [name] is a 25 y.o. female being seen today for her obstetrical visit. She is at 8355w6d gestation. Patient reports no complaints. Fetal movement: normal.  Problem List Items Addressed This Visit    None    Visit Diagnoses    Prenatal care in third trimester    -  Primary    Relevant Orders    POCT Urinalysis Dipstick      Patient Active Problem List   Diagnosis Date Noted  . Supervision of normal first pregnancy in second trimester 12/04/2015   Objective:    BP 122/75 mmHg  Pulse 96  Temp(Src) 98 F (36.7 C)  Wt 206 lb (93.441 kg)  LMP 08/19/2015 (Approximate) FHT:  150 BPM  Uterine Size: size equals dates  Presentation: unsure     Assessment:    Pregnancy @ 6055w6d weeks   Plan:     labs reviewed, problem list updated Consent signed. GBS sent TDAP offered  Rhogam given for RH negative Pediatrician: discussed. Infant feeding: plans to breastfeed. Maternity leave: not discussed. Cigarette smoking: smokes 1 PPD. Orders Placed This Encounter  Procedures  . POCT Urinalysis Dipstick   No orders of the defined types were placed in this encounter.   Follow up in 2 Weeks.

## 2016-03-28 ENCOUNTER — Encounter: Payer: Medicaid Other | Admitting: Obstetrics

## 2016-04-01 ENCOUNTER — Ambulatory Visit (INDEPENDENT_AMBULATORY_CARE_PROVIDER_SITE_OTHER): Payer: Medicaid Other | Admitting: Obstetrics

## 2016-04-01 VITALS — BP 111/74 | HR 110 | Wt 206.0 lb

## 2016-04-01 DIAGNOSIS — Z3403 Encounter for supervision of normal first pregnancy, third trimester: Secondary | ICD-10-CM

## 2016-04-01 LAB — POCT URINALYSIS DIPSTICK
Bilirubin, UA: NEGATIVE
Blood, UA: NEGATIVE
Glucose, UA: NEGATIVE
Ketones, UA: NEGATIVE
Leukocytes, UA: NEGATIVE
Nitrite, UA: NEGATIVE
Protein, UA: NEGATIVE
Spec Grav, UA: 1.015
Urobilinogen, UA: NEGATIVE
pH, UA: 5

## 2016-04-01 NOTE — Progress Notes (Signed)
Pt is having lower back pain and knee problems.

## 2016-04-04 ENCOUNTER — Telehealth: Payer: Self-pay | Admitting: *Deleted

## 2016-04-04 NOTE — Telephone Encounter (Signed)
Patient states that during her OV with you on Friday, you agreed to give her an Rx for flexeril for back pain, Rx has not been sent to her pharmacy

## 2016-04-06 ENCOUNTER — Encounter: Payer: Self-pay | Admitting: Obstetrics

## 2016-04-06 NOTE — Progress Notes (Signed)
Subjective:    [name] is a 25 y.o. female being seen today for her obstetrical visit. She is at 2140w0d gestation. Patient reports no complaints. Fetal movement: normal.  Problem List Items Addressed This Visit    None    Visit Diagnoses    Encounter for supervision of normal first pregnancy in third trimester    -  Primary    Relevant Orders    POCT urinalysis dipstick (Completed)      Patient Active Problem List   Diagnosis Date Noted  . Supervision of normal first pregnancy in second trimester 12/04/2015   Objective:    BP 111/74 mmHg  Pulse 110  Wt 206 lb (93.441 kg)  LMP 08/19/2015 (Approximate) FHT:  150 BPM  Uterine Size: size equals dates  Presentation: unsure     Assessment:    Pregnancy @ 3940w0d weeks   Plan:     labs reviewed, problem list updated Consent signed. GBS sent TDAP offered  Rhogam given for RH negative Pediatrician: discussed. Infant feeding: plans to breastfeed. Maternity leave: discussed. Cigarette smoking: smokes 1 PPD. Orders Placed This Encounter  Procedures  . POCT urinalysis dipstick   No orders of the defined types were placed in this encounter.   Follow up in 2 Weeks.

## 2016-04-11 ENCOUNTER — Other Ambulatory Visit: Payer: Self-pay | Admitting: Obstetrics

## 2016-04-11 ENCOUNTER — Telehealth: Payer: Self-pay | Admitting: *Deleted

## 2016-04-11 DIAGNOSIS — M549 Dorsalgia, unspecified: Secondary | ICD-10-CM

## 2016-04-11 MED ORDER — CYCLOBENZAPRINE HCL 10 MG PO TABS: 10.0000 mg | ORAL_TABLET | Freq: Three times a day (TID) | ORAL | Status: DC | PRN

## 2016-04-11 NOTE — Telephone Encounter (Signed)
Patient needs a letter out of work- she was supposed to have medication- but was not given Rx for back pain. Dr Clearance CootsHarper to Rx for patient she is coming to the office.

## 2016-04-14 ENCOUNTER — Ambulatory Visit (INDEPENDENT_AMBULATORY_CARE_PROVIDER_SITE_OTHER): Payer: Commercial Managed Care - PPO | Admitting: Obstetrics

## 2016-04-14 ENCOUNTER — Encounter: Payer: Self-pay | Admitting: Obstetrics

## 2016-04-14 VITALS — BP 111/79 | HR 99 | Temp 98.8°F | Wt 213.0 lb

## 2016-04-14 DIAGNOSIS — Z3403 Encounter for supervision of normal first pregnancy, third trimester: Secondary | ICD-10-CM

## 2016-04-14 LAB — POCT URINALYSIS DIPSTICK
Bilirubin, UA: NEGATIVE
Blood, UA: NEGATIVE
Glucose, UA: NEGATIVE
Ketones, UA: NEGATIVE
Leukocytes, UA: NEGATIVE
Nitrite, UA: NEGATIVE
Protein, UA: NEGATIVE
Spec Grav, UA: 1.01
Urobilinogen, UA: NEGATIVE
pH, UA: 6

## 2016-04-14 NOTE — Progress Notes (Signed)
Subjective:    [name] is a 25 y.o. female being seen today for her obstetrical visit. She is at 3559w1d gestation. Patient reports no complaints. Fetal movement: normal.  Problem List Items Addressed This Visit    None    Visit Diagnoses    Encounter for supervision of normal first pregnancy in third trimester    -  Primary    Relevant Orders    POCT urinalysis dipstick (Completed)      Patient Active Problem List   Diagnosis Date Noted  . Supervision of normal first pregnancy in second trimester 12/04/2015   Objective:    BP 111/79 mmHg  Pulse 99  Temp(Src) 98.8 F (37.1 C)  Wt 213 lb (96.616 kg)  LMP 08/19/2015 (Approximate) FHT:  150 BPM  Uterine Size: size equals dates  Presentation: cephalic     Assessment:    Pregnancy @ 1059w1d weeks   Plan:     labs reviewed, problem list updated Consent signed. GBS sent TDAP offered  Rhogam given for RH negative Pediatrician: discussed. Infant feeding: plans to breastfeed. Maternity leave: discussed. Cigarette smoking: former smoker. Orders Placed This Encounter  Procedures  . POCT urinalysis dipstick   No orders of the defined types were placed in this encounter.   F/U 2 weeks.

## 2016-04-23 LAB — OB RESULTS CONSOLE GBS: GBS: POSITIVE

## 2016-05-02 ENCOUNTER — Ambulatory Visit (INDEPENDENT_AMBULATORY_CARE_PROVIDER_SITE_OTHER): Payer: Medicaid Other | Admitting: Obstetrics

## 2016-05-02 VITALS — BP 125/84 | HR 97 | Temp 98.1°F | Wt 217.0 lb

## 2016-05-02 DIAGNOSIS — R52 Pain, unspecified: Secondary | ICD-10-CM

## 2016-05-02 DIAGNOSIS — Z3403 Encounter for supervision of normal first pregnancy, third trimester: Secondary | ICD-10-CM

## 2016-05-02 LAB — POCT URINALYSIS DIPSTICK
Bilirubin, UA: NEGATIVE
Blood, UA: NEGATIVE
Glucose, UA: NEGATIVE
Ketones, UA: NEGATIVE
Leukocytes, UA: NEGATIVE
Nitrite, UA: NEGATIVE
Protein, UA: NEGATIVE
Spec Grav, UA: 1.015
Urobilinogen, UA: 0.2
pH, UA: 5

## 2016-05-02 MED ORDER — OXYCODONE HCL 10 MG PO TABS: 10.0000 mg | ORAL_TABLET | Freq: Four times a day (QID) | ORAL | Status: DC | PRN

## 2016-05-02 NOTE — Progress Notes (Signed)
Pt c/o back pain not relieved by cyclobenzaprine and can not sleep. She c/o pelvic pressure.

## 2016-05-04 ENCOUNTER — Encounter: Payer: Self-pay | Admitting: Obstetrics

## 2016-05-04 LAB — STREP GP B NAA: Strep Gp B NAA: POSITIVE — AB

## 2016-05-04 NOTE — Progress Notes (Signed)
Subjective:    [name] is a 25 y.o. female being seen today for her obstetrical visit. She is at 3343w0d gestation. Patient reports no complaints. Fetal movement: normal.  Problem List Items Addressed This Visit    None    Visit Diagnoses    Encounter for supervision of normal first pregnancy in third trimester    -  Primary    Relevant Orders    POCT Urinalysis Dipstick (Completed)    Strep Gp B NAA (Completed)    Pain aggravated by activities of daily living        Relevant Medications    Oxycodone HCl 10 MG TABS      Patient Active Problem List   Diagnosis Date Noted  . Supervision of normal first pregnancy in second trimester 12/04/2015   Objective:    BP 125/84 mmHg  Pulse 97  Temp(Src) 98.1 F (36.7 C)  Wt 217 lb (98.431 kg)  LMP 08/19/2015 (Approximate) FHT:  150 BPM  Uterine Size: size equals dates  Presentation: unsure     Assessment:    Pregnancy @ 4943w0d weeks   Plan:     labs reviewed, problem list updated Consent signed. GBS sent TDAP offered  Rhogam given for RH negative Pediatrician: discussed. Infant feeding: plans to breastfeed. Maternity leave: discussed. Cigarette smoking: smokes 1 PPD. Orders Placed This Encounter  Procedures  . Strep Gp B NAA  . POCT Urinalysis Dipstick   Meds ordered this encounter  Medications  . Oxycodone HCl 10 MG TABS    Sig: Take 1 tablet (10 mg total) by mouth every 6 (six) hours as needed.    Dispense:  40 tablet    Refill:  0   Follow up in 1 Week.

## 2016-05-11 ENCOUNTER — Ambulatory Visit (INDEPENDENT_AMBULATORY_CARE_PROVIDER_SITE_OTHER): Payer: Commercial Managed Care - PPO | Admitting: Obstetrics

## 2016-05-11 ENCOUNTER — Encounter: Payer: Self-pay | Admitting: Obstetrics

## 2016-05-11 ENCOUNTER — Encounter: Payer: Commercial Managed Care - PPO | Admitting: Obstetrics

## 2016-05-11 VITALS — BP 138/89 | HR 101 | Temp 98.6°F | Wt 226.0 lb

## 2016-05-11 DIAGNOSIS — Z3403 Encounter for supervision of normal first pregnancy, third trimester: Secondary | ICD-10-CM

## 2016-05-11 LAB — POCT URINALYSIS DIPSTICK
Bilirubin, UA: NEGATIVE
Blood, UA: NEGATIVE
Glucose, UA: NEGATIVE
Leukocytes, UA: NEGATIVE
Nitrite, UA: NEGATIVE
Protein, UA: NEGATIVE
Spec Grav, UA: 1.01
Urobilinogen, UA: NEGATIVE
pH, UA: 6

## 2016-05-11 NOTE — Progress Notes (Signed)
Subjective:    Michelle RetortHollie D Ryan is a 25 y.o. female being seen today for her obstetrical visit. She is at 2639w0d gestation. Patient reports backache. Fetal movement: normal.  Problem List Items Addressed This Visit    None     Patient Active Problem List   Diagnosis Date Noted  . Supervision of normal first pregnancy in second trimester 12/04/2015    Objective:    BP 138/89 mmHg  Pulse 101  Temp(Src) 98.6 F (37 C)  Wt 226 lb (102.513 kg)  LMP 08/19/2015 (Approximate) FHT: 150 BPM  Uterine Size: size greater than dates    Assessment:    Pregnancy @ 2239w0d weeks   Plan:   Plans for delivery: Vaginal anticipated; labs reviewed; problem list updated Counseling: Consent signed. Infant feeding: plans to breastfeed. Cigarette smoking: smokes 1 PPD. L&D discussion: symptoms of labor, discussed when to call, discussed what number to call, anesthetic/analgesic options reviewed and delivering clinician:  plans no preference. Postpartum supports and preparation.  Follow up in 1 Week.

## 2016-05-12 ENCOUNTER — Telehealth: Payer: Self-pay | Admitting: *Deleted

## 2016-05-12 NOTE — Telephone Encounter (Signed)
See phone note for this encounter. 

## 2016-05-17 ENCOUNTER — Ambulatory Visit (INDEPENDENT_AMBULATORY_CARE_PROVIDER_SITE_OTHER): Payer: Medicaid Other | Admitting: Obstetrics and Gynecology

## 2016-05-17 ENCOUNTER — Encounter: Payer: Self-pay | Admitting: Obstetrics and Gynecology

## 2016-05-17 VITALS — BP 126/87 | HR 100 | Wt 226.0 lb

## 2016-05-17 DIAGNOSIS — O99820 Streptococcus B carrier state complicating pregnancy: Secondary | ICD-10-CM | POA: Insufficient documentation

## 2016-05-17 DIAGNOSIS — E669 Obesity, unspecified: Secondary | ICD-10-CM | POA: Insufficient documentation

## 2016-05-17 DIAGNOSIS — F1210 Cannabis abuse, uncomplicated: Secondary | ICD-10-CM

## 2016-05-17 DIAGNOSIS — Z3483 Encounter for supervision of other normal pregnancy, third trimester: Secondary | ICD-10-CM

## 2016-05-17 DIAGNOSIS — Z6841 Body Mass Index (BMI) 40.0 and over, adult: Secondary | ICD-10-CM

## 2016-05-17 DIAGNOSIS — Z720 Tobacco use: Secondary | ICD-10-CM | POA: Insufficient documentation

## 2016-05-17 DIAGNOSIS — Z3493 Encounter for supervision of normal pregnancy, unspecified, third trimester: Secondary | ICD-10-CM

## 2016-05-17 DIAGNOSIS — O99210 Obesity complicating pregnancy, unspecified trimester: Secondary | ICD-10-CM

## 2016-05-17 NOTE — Progress Notes (Signed)
Prenatal Visit Note Date: 05/17/2016 Clinic: [facility]  Subjective:  [name] is a 25 y.o. G1P0 at [redacted]w[redacted]d being seen today for ongoing prenatal care.  She is currently monitored for the following issues for this low-risk pregnancy and has Supervision of normal pregnancy in third trimester; Obesity in pregnancy; BMI 40.0-44.9, adult (HCC); Tobacco abuse; GBS (group B Streptococcus carrier), +RV culture, currently pregnant; and Marijuana abuse on her problem list.  Patient reports no complaints.   Contractions: Not present. Vag. Bleeding: None.  Movement: Present. Denies leaking of fluid.   The following portions of the patient's history were reviewed and updated as appropriate: allergies, current medications, past family history, past medical history, past social history, past surgical history and problem list. Problem list updated.  Objective:   Vitals:   05/17/16 0951  BP: 126/87  Pulse: 100  Weight: 226 lb (102.5 kg)    Fetal Status: Fetal Heart Rate (bpm): 145   Movement: Present     General:  Alert, oriented and cooperative. Patient is in no acute distress.  Skin: Skin is warm and dry. No rash noted.   Cardiovascular: Normal heart rate noted  Respiratory: Normal respiratory effort, no problems with respiration noted  Abdomen: Soft, gravid, appropriate for gestational age. Pain/Pressure: Present     Pelvic:  Cervical exam deferred        Extremities: Normal range of motion.  Edema: Mild pitting, slight indentation  Mental Status: Normal mood and affect. Normal behavior. Normal judgment and thought content.   Urinalysis: Urine Protein: Negative Urine Glucose: Negative  Assessment and Plan:  Pregnancy: G1P0 at [redacted]w[redacted]d  1. Obesity in pregnancy Routine care  2. BMI 40.0-44.9, adult (HCC) See above  3. Tobacco abuse Counseled on determents to pregnancy  4. GBS (group B Streptococcus carrier), +RV culture, currently pregnant Patient aware  5. Supervision of normal  pregnancy in third trimester Set up PDIOL nv. D/w pt. Nexplanon  6. Marijuana abuse Screening UDS on admit  Term labor symptoms and general obstetric precautions including but not limited to vaginal bleeding, contractions, leaking of fluid and fetal movement were reviewed in detail with the patient. Please refer to After Visit Summary for other counseling recommendations.   1wk RTC   [place], MD

## 2016-05-19 ENCOUNTER — Encounter: Payer: Commercial Managed Care - PPO | Admitting: Obstetrics and Gynecology

## 2016-05-19 ENCOUNTER — Other Ambulatory Visit: Payer: Self-pay | Admitting: Obstetrics

## 2016-05-25 ENCOUNTER — Ambulatory Visit (INDEPENDENT_AMBULATORY_CARE_PROVIDER_SITE_OTHER): Payer: Medicaid Other | Admitting: Obstetrics and Gynecology

## 2016-05-25 VITALS — BP 128/81 | HR 97 | Temp 98.6°F | Wt 218.6 lb

## 2016-05-25 DIAGNOSIS — Z3493 Encounter for supervision of normal pregnancy, unspecified, third trimester: Secondary | ICD-10-CM

## 2016-05-25 DIAGNOSIS — O99210 Obesity complicating pregnancy, unspecified trimester: Secondary | ICD-10-CM

## 2016-05-25 DIAGNOSIS — Z3403 Encounter for supervision of normal first pregnancy, third trimester: Secondary | ICD-10-CM

## 2016-05-25 DIAGNOSIS — O99820 Streptococcus B carrier state complicating pregnancy: Secondary | ICD-10-CM

## 2016-05-25 DIAGNOSIS — Z6841 Body Mass Index (BMI) 40.0 and over, adult: Secondary | ICD-10-CM

## 2016-05-25 DIAGNOSIS — Z720 Tobacco use: Secondary | ICD-10-CM

## 2016-05-25 NOTE — Progress Notes (Signed)
Pt c/o pain and pressure low back and pelvis

## 2016-05-25 NOTE — Progress Notes (Signed)
Subjective:  [name] is a 25 y.o. G1P0 at [redacted]w[redacted]d being seen today for ongoing prenatal care.  She is currently monitored for the following issues for this high-risk pregnancy and has Supervision of normal pregnancy in third trimester; Obesity in pregnancy; BMI 40.0-44.9, adult (HCC); Tobacco abuse; GBS (group B Streptococcus carrier), +RV culture, currently pregnant; and Marijuana abuse on her problem list.  Patient reports no complaints.  Contractions: Not present. Vag. Bleeding: None.  Movement: Present. Denies leaking of fluid.   The following portions of the patient's history were reviewed and updated as appropriate: allergies, current medications, past family history, past medical history, past social history, past surgical history and problem list. Problem list updated.  Objective:   Vitals:   05/25/16 1311  BP: 128/81  Pulse: 97  Temp: 98.6 F (37 C)  Weight: 218 lb 9.6 oz (99.2 kg)    Fetal Status:     Movement: Present  Presentation: Vertex  General:  Alert, oriented and cooperative. Patient is in no acute distress.  Skin: Skin is warm and dry. No rash noted.   Cardiovascular: Normal heart rate noted  Respiratory: Normal respiratory effort, no problems with respiration noted  Abdomen: Soft, gravid, appropriate for gestational age. Pain/Pressure: Present     Pelvic:  Cervical exam performed Dilation: 2 Effacement (%): 50 Station: -3  Extremities: Normal range of motion.  Edema: None  Mental Status: Normal mood and affect. Normal behavior. Normal judgment and thought content.   Urinalysis:      Assessment and Plan:  Pregnancy: G1P0 at [redacted]w[redacted]d  1. Encounter for supervision of normal first pregnancy in third trimester  - POCT Urinalysis Dipstick  2. Supervision of normal pregnancy in third trimester Patient is doing well and will be scheduled for IOL at 41 weeks Post date fetal testing on Monday Membranes stripped today  3. GBS (group B Streptococcus carrier),  +RV culture, currently pregnant Will need prophylaxis in labor  4. Obesity in pregnancy   5. Tobacco abuse   6. BMI 40.0-44.9, adult [facility])   Term labor symptoms and general obstetric precautions including but not limited to vaginal bleeding, contractions, leaking of fluid and fetal movement were reviewed in detail with the patient. Please refer to After Visit Summary for other counseling recommendations.  No Follow-up on file.   Catalina Antigua, MD

## 2016-05-26 NOTE — Addendum Note (Signed)
Addended by: Catalina Antigua on: 05/26/2016 09:54 AM   Modules accepted: Orders

## 2016-05-27 ENCOUNTER — Encounter (HOSPITAL_COMMUNITY): Payer: Self-pay | Admitting: *Deleted

## 2016-05-27 ENCOUNTER — Telehealth (HOSPITAL_COMMUNITY): Payer: Self-pay | Admitting: *Deleted

## 2016-05-27 ENCOUNTER — Inpatient Hospital Stay (HOSPITAL_COMMUNITY)
Admission: AD | Admit: 2016-05-27 | Discharge: 2016-05-27 | Disposition: A | Discharge status: Home / Self Care | Payer: Medicaid Other | Source: Ambulatory Visit | Attending: Obstetrics & Gynecology | Admitting: Obstetrics & Gynecology

## 2016-05-27 DIAGNOSIS — N898 Other specified noninflammatory disorders of vagina: Secondary | ICD-10-CM | POA: Diagnosis not present

## 2016-05-27 DIAGNOSIS — Z3A00 Weeks of gestation of pregnancy not specified: Secondary | ICD-10-CM | POA: Diagnosis not present

## 2016-05-27 DIAGNOSIS — O26899 Other specified pregnancy related conditions, unspecified trimester: Secondary | ICD-10-CM | POA: Diagnosis not present

## 2016-05-27 DIAGNOSIS — O368190 Decreased fetal movements, unspecified trimester, not applicable or unspecified: Secondary | ICD-10-CM | POA: Insufficient documentation

## 2016-05-27 NOTE — Discharge Instructions (Signed)
Fetal Movement Counts °Patient Name: __________________________________________________ Patient Due Date: ____________________ °Performing a fetal movement count is highly recommended in high-risk pregnancies, but it is good for every pregnant woman to do. Your health care provider may ask you to start counting fetal movements at 28 weeks of the pregnancy. Fetal movements often increase: °· After eating a full meal. °· After physical activity. °· After eating or drinking something sweet or cold. °· At rest. °Pay attention to when you feel the baby is most active. This will help you notice a pattern of your baby's sleep and wake cycles and what factors contribute to an increase in fetal movement. It is important to perform a fetal movement count at the same time each day when your baby is normally most active.  °HOW TO COUNT FETAL MOVEMENTS °1. Find a quiet and comfortable area to sit or lie down on your left side. Lying on your left side provides the best blood and oxygen circulation to your baby. °2. Write down the day and time on a sheet of paper or in a journal. °3. Start counting kicks, flutters, swishes, rolls, or jabs in a 2-hour period. You should feel at least 10 movements within 2 hours. °4. If you do not feel 10 movements in 2 hours, wait 2-3 hours and count again. Look for a change in the pattern or not enough counts in 2 hours. °SEEK MEDICAL CARE IF: °· You feel less than 10 counts in 2 hours, tried twice. °· There is no movement in over an hour. °· The pattern is changing or taking longer each day to reach 10 counts in 2 hours. °· You feel the baby is not moving as he or she usually does. °Date: ____________ Movements: ____________ Start time: ____________ Finish time: ____________  °Date: ____________ Movements: ____________ Start time: ____________ Finish time: ____________ °Date: ____________ Movements: ____________ Start time: ____________ Finish time: ____________ °Date: ____________ Movements:  ____________ Start time: ____________ Finish time: ____________ °Date: ____________ Movements: ____________ Start time: ____________ Finish time: ____________ °Date: ____________ Movements: ____________ Start time: ____________ Finish time: ____________ °Date: ____________ Movements: ____________ Start time: ____________ Finish time: ____________ °Date: ____________ Movements: ____________ Start time: ____________ Finish time: ____________  °Date: ____________ Movements: ____________ Start time: ____________ Finish time: ____________ °Date: ____________ Movements: ____________ Start time: ____________ Finish time: ____________ °Date: ____________ Movements: ____________ Start time: ____________ Finish time: ____________ °Date: ____________ Movements: ____________ Start time: ____________ Finish time: ____________ °Date: ____________ Movements: ____________ Start time: ____________ Finish time: ____________ °Date: ____________ Movements: ____________ Start time: ____________ Finish time: ____________ °Date: ____________ Movements: ____________ Start time: ____________ Finish time: ____________  °Date: ____________ Movements: ____________ Start time: ____________ Finish time: ____________ °Date: ____________ Movements: ____________ Start time: ____________ Finish time: ____________ °Date: ____________ Movements: ____________ Start time: ____________ Finish time: ____________ °Date: ____________ Movements: ____________ Start time: ____________ Finish time: ____________ °Date: ____________ Movements: ____________ Start time: ____________ Finish time: ____________ °Date: ____________ Movements: ____________ Start time: ____________ Finish time: ____________ °Date: ____________ Movements: ____________ Start time: ____________ Finish time: ____________  °Date: ____________ Movements: ____________ Start time: ____________ Finish time: ____________ °Date: ____________ Movements: ____________ Start time: ____________ Finish  time: ____________ °Date: ____________ Movements: ____________ Start time: ____________ Finish time: ____________ °Date: ____________ Movements: ____________ Start time: ____________ Finish time: ____________ °Date: ____________ Movements: ____________ Start time: ____________ Finish time: ____________ °Date: ____________ Movements: ____________ Start time: ____________ Finish time: ____________ °Date: ____________ Movements: ____________ Start time: ____________ Finish time: ____________  °Date: ____________ Movements: ____________ Start time: ____________ Finish   time: ____________ °Date: ____________ Movements: ____________ Start time: ____________ Finish time: ____________ °Date: ____________ Movements: ____________ Start time: ____________ Finish time: ____________ °Date: ____________ Movements: ____________ Start time: ____________ Finish time: ____________ °Date: ____________ Movements: ____________ Start time: ____________ Finish time: ____________ °Date: ____________ Movements: ____________ Start time: ____________ Finish time: ____________ °Date: ____________ Movements: ____________ Start time: ____________ Finish time: ____________  °Date: ____________ Movements: ____________ Start time: ____________ Finish time: ____________ °Date: ____________ Movements: ____________ Start time: ____________ Finish time: ____________ °Date: ____________ Movements: ____________ Start time: ____________ Finish time: ____________ °Date: ____________ Movements: ____________ Start time: ____________ Finish time: ____________ °Date: ____________ Movements: ____________ Start time: ____________ Finish time: ____________ °Date: ____________ Movements: ____________ Start time: ____________ Finish time: ____________ °Date: ____________ Movements: ____________ Start time: ____________ Finish time: ____________  °Date: ____________ Movements: ____________ Start time: ____________ Finish time: ____________ °Date: ____________  Movements: ____________ Start time: ____________ Finish time: ____________ °Date: ____________ Movements: ____________ Start time: ____________ Finish time: ____________ °Date: ____________ Movements: ____________ Start time: ____________ Finish time: ____________ °Date: ____________ Movements: ____________ Start time: ____________ Finish time: ____________ °Date: ____________ Movements: ____________ Start time: ____________ Finish time: ____________ °Date: ____________ Movements: ____________ Start time: ____________ Finish time: ____________  °Date: ____________ Movements: ____________ Start time: ____________ Finish time: ____________ °Date: ____________ Movements: ____________ Start time: ____________ Finish time: ____________ °Date: ____________ Movements: ____________ Start time: ____________ Finish time: ____________ °Date: ____________ Movements: ____________ Start time: ____________ Finish time: ____________ °Date: ____________ Movements: ____________ Start time: ____________ Finish time: ____________ °Date: ____________ Movements: ____________ Start time: ____________ Finish time: ____________ °  °This information is not intended to replace advice given to you by your health care provider. Make sure you discuss any questions you have with your health care provider. °  °Document Released: 11/09/2006 Document Revised: 10/31/2014 Document Reviewed: 08/06/2012 °Elsevier Interactive Patient Education ©2016 [employer] °Braxton Hicks Contractions °Contractions of the uterus can occur throughout pregnancy. Contractions are not always a sign that you are in labor.  °WHAT ARE BRAXTON HICKS CONTRACTIONS?  °Contractions that occur before labor are called Braxton Hicks contractions, or false labor. Toward the end of pregnancy (32-34 weeks), these contractions can develop more often and may become more forceful. This is not true labor because these contractions do not result in opening (dilatation) and thinning of  the cervix. They are sometimes difficult to tell apart from true labor because these contractions can be forceful and people have different pain tolerances. You should not feel embarrassed if you go to the hospital with false labor. Sometimes, the only way to tell if you are in true labor is for your health care provider to look for changes in the cervix. °If there are no prenatal problems or other health problems associated with the pregnancy, it is completely safe to be sent home with false labor and await the onset of true labor. °HOW CAN YOU TELL THE DIFFERENCE BETWEEN TRUE AND FALSE LABOR? °False Labor °· The contractions of false labor are usually shorter and not as hard as those of true labor.   °· The contractions are usually irregular.   °· The contractions are often felt in the front of the lower abdomen and in the groin.   °· The contractions may go away when you walk around or change positions while lying down.   °· The contractions get weaker and are shorter lasting as time goes on.   °· The contractions do not usually become progressively stronger, regular, and closer together as with true labor.   °True Labor °· Contractions in true   labor last 30-70 seconds, become very regular, usually become more intense, and increase in frequency.   °· The contractions do not go away with walking.   °· The discomfort is usually felt in the top of the uterus and spreads to the lower abdomen and low back.   °· True labor can be determined by your health care provider with an exam. This will show that the cervix is dilating and getting thinner.   °WHAT TO REMEMBER °· Keep up with your usual exercises and follow other instructions given by your health care provider.   °· Take medicines as directed by your health care provider.   °· Keep your regular prenatal appointments.   °· Eat and drink lightly if you think you are going into labor.   °· If Braxton Hicks contractions are making you uncomfortable:   °¨ Change your  position from lying down or resting to walking, or from walking to resting.   °¨ Sit and rest in a tub of warm water.   °¨ Drink 2-3 glasses of water. Dehydration may cause these contractions.   °¨ Do slow and deep breathing several times an hour.   °WHEN SHOULD I SEEK IMMEDIATE MEDICAL CARE? °Seek immediate medical care if: °· Your contractions become stronger, more regular, and closer together.   °· You have fluid leaking or gushing from your vagina.   °· You have a fever.   °· You pass blood-tinged mucus.   °· You have vaginal bleeding.   °· You have continuous abdominal pain.   °· You have low back pain that you never had before.   °· You feel your baby's head pushing down and causing pelvic pressure.   °· Your baby is not moving as much as it used to.   °  °This information is not intended to replace advice given to you by your health care provider. Make sure you discuss any questions you have with your health care provider. °  °Document Released: 10/10/2005 Document Revised: 10/15/2013 Document Reviewed: 07/22/2013 °Elsevier Interactive Patient Education ©2016 [employer] ° °

## 2016-05-27 NOTE — MAU Note (Addendum)
Pt reports that she has lost her mucus plug today and has had more clear mucus discharge than normal. Pt also reports decrease in movement today.

## 2016-05-27 NOTE — Telephone Encounter (Signed)
Preadmission screen  

## 2016-05-27 NOTE — Progress Notes (Signed)
Dr. Genevie Ann updated on Pt status and reviewed tracing. Pt to be discharged home with labor precautions.

## 2016-06-01 ENCOUNTER — Inpatient Hospital Stay (HOSPITAL_COMMUNITY)
Admission: RE | Admit: 2016-06-01 | Discharge: 2016-06-05 | DRG: 765 | Disposition: A | Discharge status: Home / Self Care | Payer: Medicaid Other | Source: Ambulatory Visit | Attending: Obstetrics & Gynecology | Admitting: Obstetrics & Gynecology

## 2016-06-01 ENCOUNTER — Encounter (HOSPITAL_COMMUNITY): Payer: Self-pay

## 2016-06-01 DIAGNOSIS — F17210 Nicotine dependence, cigarettes, uncomplicated: Secondary | ICD-10-CM | POA: Diagnosis present

## 2016-06-01 DIAGNOSIS — O99824 Streptococcus B carrier state complicating childbirth: Secondary | ICD-10-CM | POA: Diagnosis present

## 2016-06-01 DIAGNOSIS — O99214 Obesity complicating childbirth: Secondary | ICD-10-CM | POA: Diagnosis present

## 2016-06-01 DIAGNOSIS — F1210 Cannabis abuse, uncomplicated: Secondary | ICD-10-CM | POA: Diagnosis present

## 2016-06-01 DIAGNOSIS — Z3A41 41 weeks gestation of pregnancy: Secondary | ICD-10-CM | POA: Diagnosis not present

## 2016-06-01 DIAGNOSIS — O99334 Smoking (tobacco) complicating childbirth: Secondary | ICD-10-CM | POA: Diagnosis present

## 2016-06-01 DIAGNOSIS — O99344 Other mental disorders complicating childbirth: Secondary | ICD-10-CM | POA: Diagnosis present

## 2016-06-01 DIAGNOSIS — Z6841 Body Mass Index (BMI) 40.0 and over, adult: Secondary | ICD-10-CM | POA: Diagnosis not present

## 2016-06-01 DIAGNOSIS — O99324 Drug use complicating childbirth: Secondary | ICD-10-CM | POA: Diagnosis present

## 2016-06-01 DIAGNOSIS — Z98891 History of uterine scar from previous surgery: Secondary | ICD-10-CM

## 2016-06-01 DIAGNOSIS — O480 Post-term pregnancy: Principal | ICD-10-CM | POA: Diagnosis present

## 2016-06-01 DIAGNOSIS — E669 Obesity, unspecified: Secondary | ICD-10-CM

## 2016-06-01 DIAGNOSIS — F419 Anxiety disorder, unspecified: Secondary | ICD-10-CM | POA: Diagnosis present

## 2016-06-01 DIAGNOSIS — O324XX0 Maternal care for high head at term, not applicable or unspecified: Secondary | ICD-10-CM | POA: Diagnosis present

## 2016-06-01 DIAGNOSIS — O99820 Streptococcus B carrier state complicating pregnancy: Secondary | ICD-10-CM

## 2016-06-01 LAB — TYPE AND SCREEN
ABO/RH(D): B POS
Antibody Screen: NEGATIVE

## 2016-06-01 LAB — CBC
HCT: 32.4 % — ABNORMAL LOW (ref 36.0–46.0)
Hemoglobin: 11 g/dL — ABNORMAL LOW (ref 12.0–15.0)
MCH: 28.7 pg (ref 26.0–34.0)
MCHC: 34 g/dL (ref 30.0–36.0)
MCV: 84.6 fL (ref 78.0–100.0)
Platelets: 340 10*3/uL (ref 150–400)
RBC: 3.83 MIL/uL — ABNORMAL LOW (ref 3.87–5.11)
RDW: 13.3 % (ref 11.5–15.5)
WBC: 17.6 10*3/uL — ABNORMAL HIGH (ref 4.0–10.5)

## 2016-06-01 LAB — ABO/RH: ABO/RH(D): B POS

## 2016-06-01 MED ORDER — SOD CITRATE-CITRIC ACID 500-334 MG/5ML PO SOLN
30.0000 mL | ORAL | Status: DC | PRN
  Administered 2016-06-03: 30 mL via ORAL
  Filled 2016-06-01: qty 15

## 2016-06-01 MED ORDER — LACTATED RINGERS IV SOLN
500.0000 mL | INTRAVENOUS | Status: DC | PRN
  Administered 2016-06-02 – 2016-06-03 (×3): 1000 mL via INTRAVENOUS

## 2016-06-01 MED ORDER — OXYCODONE-ACETAMINOPHEN 5-325 MG PO TABS: 2.0000 | ORAL_TABLET | ORAL | Status: DC | PRN

## 2016-06-01 MED ORDER — TERBUTALINE SULFATE 1 MG/ML IJ SOLN: 0.2500 mg | Freq: Once | INTRAMUSCULAR | Status: DC | PRN

## 2016-06-01 MED ORDER — OXYTOCIN 40 UNITS IN LACTATED RINGERS INFUSION - SIMPLE MED
2.5000 [IU]/h | INTRAVENOUS | Status: DC
  Filled 2016-06-01: qty 1000

## 2016-06-01 MED ORDER — OXYTOCIN 40 UNITS IN LACTATED RINGERS INFUSION - SIMPLE MED
1.0000 m[IU]/min | INTRAVENOUS | Status: DC
  Administered 2016-06-01: 2 m[IU]/min via INTRAVENOUS

## 2016-06-01 MED ORDER — ACETAMINOPHEN 325 MG PO TABS
650.0000 mg | ORAL_TABLET | ORAL | Status: DC | PRN
  Administered 2016-06-01 – 2016-06-02 (×3): 650 mg via ORAL
  Filled 2016-06-01 (×3): qty 2

## 2016-06-01 MED ORDER — LACTATED RINGERS IV SOLN
INTRAVENOUS | Status: DC
  Administered 2016-06-01 – 2016-06-03 (×7): via INTRAVENOUS

## 2016-06-01 MED ORDER — PENICILLIN G POTASSIUM 5000000 UNITS IJ SOLR
5.0000 10*6.[IU] | Freq: Once | INTRAVENOUS | Status: AC
  Administered 2016-06-01: 5 10*6.[IU] via INTRAVENOUS
  Filled 2016-06-01: qty 5

## 2016-06-01 MED ORDER — OXYCODONE-ACETAMINOPHEN 5-325 MG PO TABS: 1.0000 | ORAL_TABLET | ORAL | Status: DC | PRN

## 2016-06-01 MED ORDER — MISOPROSTOL 25 MCG QUARTER TABLET
25.0000 ug | ORAL_TABLET | ORAL | Status: DC
  Administered 2016-06-01: 25 ug via VAGINAL
  Filled 2016-06-01 (×5): qty 1
  Filled 2016-06-01: qty 0.25
  Filled 2016-06-01 (×3): qty 1

## 2016-06-01 MED ORDER — PENICILLIN G POTASSIUM 5000000 UNITS IJ SOLR
2.5000 10*6.[IU] | INTRAMUSCULAR | Status: DC
  Administered 2016-06-01 – 2016-06-02 (×7): 2.5 10*6.[IU] via INTRAVENOUS
  Filled 2016-06-01 (×12): qty 2.5

## 2016-06-01 MED ORDER — LIDOCAINE HCL (PF) 1 % IJ SOLN
30.0000 mL | INTRAMUSCULAR | Status: DC | PRN
  Filled 2016-06-01: qty 30

## 2016-06-01 MED ORDER — ONDANSETRON HCL 4 MG/2ML IJ SOLN: 4.0000 mg | Freq: Four times a day (QID) | INTRAMUSCULAR | Status: DC | PRN

## 2016-06-01 MED ORDER — OXYTOCIN BOLUS FROM INFUSION: 500.0000 mL | Freq: Once | INTRAVENOUS | Status: DC

## 2016-06-01 NOTE — Anesthesia Pain Management Evaluation Note (Signed)
  [facility] Pain Management Visit Note  Patient: [name], 25 y.o., female  "Hello I am a member of the anesthesia team at [facility]. We have an anesthesia team available at all times to provide care throughout the hospital, including epidural management and anesthesia for C-section. I don't know your plan for the delivery whether it a natural birth, water birth, IV sedation, nitrous supplementation, doula or epidural, but we want to meet your pain goals."   1.Was your pain managed to your expectations on prior hospitalizations?   No prior hospitalizations  2.What is your expectation for pain management during this hospitalization?     Labor support without medications and Epidural  3.How can we help you reach that goal?  Explain options for pain control with labor  Record the patient's initial score and the patient's pain goal.   Pain: 0  Pain Goal: 7 [facility] wants you to be able to say your pain was always managed very well.  Ninfa Wojahn 06/01/2016

## 2016-06-01 NOTE — H&P (Signed)
LABOR AND DELIVERY ADMISSION HISTORY AND PHYSICAL NOTE  [name] is a 25 y.o. female G1P0 with IUP at [redacted]w[redacted]d by LMP and 19wUS presenting for PDIOL.   She reports positive fetal movement. She denies leakage of fluid or vaginal bleeding.  Prenatal History/Complications:  Past Medical History: Past Medical History:  Diagnosis Date  . ADHD (attention deficit hyperactivity disorder)     Past Surgical History: Past Surgical History:  Procedure Laterality Date  . MULTIPLE EXTRACTIONS WITH ALVEOLOPLASTY  06/28/2012   Procedure: MULTIPLE EXTRACION WITH ALVEOLOPLASTY;  Surgeon: Georgia Lopes, DDS;  Location: [facility] OR;  Service: Oral Surgery;  Laterality: Bilateral;  extract teeth numbers 2,3,4,5,6,7,8,9,10,11,13,14,19,20,22,23,24,26,27 with alveoplasty all quadrants     Obstetrical History: OB History    Gravida Para Term Preterm AB Living   1             SAB TAB Ectopic Multiple Live Births           0      Social History: Social History   Social History  . Marital status: Single    Spouse name: N/A  . Number of children: N/A  . Years of education: N/A   Social History Main Topics  . Smoking status: Current Every Day Smoker    Packs/day: 1.00    Years: 7.00    Types: Cigarettes  . Smokeless tobacco: Never Used  . Alcohol use No  . Drug use:     Types: Marijuana     Comment:  last used 1.5 weeks ago  . Sexual activity: Yes    Birth control/ protection: None   Other Topics Concern  . None   Social History Narrative  . None    Family History: History reviewed. No pertinent family history.  Allergies: No Known Allergies  Prescriptions Prior to Admission  Medication Sig Dispense Refill Last Dose  . loratadine (CLARITIN) 10 MG tablet Take 1 tablet (10 mg total) by mouth daily. 30 tablet 11 05/31/2016 at Unknown time  . omeprazole (PRILOSEC) 20 MG capsule Take 1 capsule (20 mg total) by mouth 2 (two) times daily before a meal. 60 capsule 5 05/31/2016 at Unknown  time  . Oxycodone HCl 10 MG TABS Take 1 tablet (10 mg total) by mouth every 6 (six) hours as needed. (Patient taking differently: Take 10 mg by mouth every 6 (six) hours as needed (back pain). ) 40 tablet 0 05/31/2016 at Unknown time  . Prenatal Vit-Fe Phos-FA-Omega (VITAFOL GUMMIES) 3.33-0.333-34.8 MG CHEW Chew 3 tablets by mouth daily. 90 tablet 12 05/31/2016 at Unknown time  . acetaminophen (TYLENOL) 325 MG tablet Take 650 mg by mouth every 6 (six) hours as needed for moderate pain.    05/27/2016 at Unknown time  . cyclobenzaprine (FLEXERIL) 10 MG tablet Take 1 tablet (10 mg total) by mouth every 8 (eight) hours as needed for muscle spasms. (Patient not taking: Reported on 05/17/2016) 30 tablet 1 prn     Review of Systems   All systems reviewed and negative except as stated in HPI  Blood pressure 118/66, pulse 91, temperature 98.2 F (36.8 C), temperature source Oral, resp. rate 18, height  (1.575 m), weight 99.8 kg (220 lb), last menstrual period 08/19/2015. General appearance: alert, cooperative and no distress Lungs: clear to auscultation bilaterally Heart: regular rate and rhythm Abdomen: soft, non-tender; bowel sounds normal Extremities: No calf swelling or tenderness Presentation: cephalic Fetal monitoring: cat 1 Uterine activity: UCs q5-73min     Prenatal labs: ABO,  Rh: B/POS/-- (02/10 1403) Antibody: NEG (02/10 1403) Rubella: !Error! RPR: Non Reactive (05/04 1040)  HBsAg: NEGATIVE (02/10 1403)  HIV: Non Reactive (05/04 1040)  GBS: Positive (07/10 1416)  1 hr Glucola: neg 2 hr Genetic screening:  Not done Anatomy US: normal female  Prenatal Transfer Tool  Maternal Diabetes: No Genetic Screening: Declined Maternal Ultrasounds/Referrals: Normal Fetal Ultrasounds or other Referrals:  None Maternal Substance Abuse:  Yes:  Type: Smoker, Marijuana Significant Maternal Medications:  None Significant Maternal Lab Results: Lab values include: Group B Strep  positive  Results for orders placed or performed during the hospital encounter of 06/01/16 (from the past 24 hour(s))  CBC   Collection Time: 06/01/16  8:20 AM  Result Value Ref Range   WBC 17.6 (H) 4.0 - 10.5 K/uL   RBC 3.83 (L) 3.87 - 5.11 MIL/uL   Hemoglobin 11.0 (L) 12.0 - 15.0 g/dL   HCT 16.132.4 (L) 09.636.0 - 04.546.0 %   MCV 84.6 78.0 - 100.0 fL   MCH 28.7 26.0 - 34.0 pg   MCHC 34.0 30.0 - 36.0 g/dL   RDW 40.913.3 81.111.5 - 91.415.5 %   Platelets 340 150 - 400 K/uL    Patient Active Problem List   Diagnosis Date Noted  . Post-dates pregnancy 06/01/2016  . Obesity in pregnancy 05/17/2016  . BMI 40.0-44.9, adult (HCC) 05/17/2016  . Tobacco abuse 05/17/2016  . GBS (group B Streptococcus carrier), +RV culture, currently pregnant 05/17/2016  . Marijuana abuse 05/17/2016  . Supervision of normal pregnancy in third trimester 12/04/2015    Assessment: [name] is a 25 y.o. G1P0 at 4110w0d here for PDIOL.   #Labor: cytotec, FB@1000  #Pain: undecided #FWB: Cat 1 #ID:  GBS pos, will start PCN when in active labor #MOF: breast/bottle #MOC: nexplanon  Loni MuseKate Timberlake 06/01/2016, 9:19 AM   OB FELLOW HISTORY AND PHYSICAL ATTESTATION  I have seen and examined this patient; I agree with above documentation in the resident's note.    Ernestina Pennaicholas Breaunna Gottlieb 06/01/2016, 1:25 PM

## 2016-06-02 ENCOUNTER — Inpatient Hospital Stay (HOSPITAL_COMMUNITY): Payer: Medicaid Other | Admitting: Anesthesiology

## 2016-06-02 ENCOUNTER — Encounter (HOSPITAL_COMMUNITY): Payer: Self-pay

## 2016-06-02 LAB — RPR: RPR Ser Ql: NONREACTIVE

## 2016-06-02 LAB — RAPID URINE DRUG SCREEN, HOSP PERFORMED
Amphetamines: NOT DETECTED
Barbiturates: NOT DETECTED
Benzodiazepines: NOT DETECTED
Cocaine: NOT DETECTED
Opiates: NOT DETECTED
Tetrahydrocannabinol: POSITIVE — AB

## 2016-06-02 MED ORDER — FENTANYL 2.5 MCG/ML BUPIVACAINE 1/10 % EPIDURAL INFUSION (WH - ANES)
14.0000 mL/h | INTRAMUSCULAR | Status: DC | PRN
  Administered 2016-06-02 (×4): 14 mL/h via EPIDURAL
  Filled 2016-06-02 (×4): qty 125

## 2016-06-02 MED ORDER — EPHEDRINE 5 MG/ML INJ: 10.0000 mg | INTRAVENOUS | Status: DC | PRN

## 2016-06-02 MED ORDER — PHENYLEPHRINE 40 MCG/ML (10ML) SYRINGE FOR IV PUSH (FOR BLOOD PRESSURE SUPPORT)
80.0000 ug | PREFILLED_SYRINGE | INTRAVENOUS | Status: DC | PRN
  Filled 2016-06-02: qty 10

## 2016-06-02 MED ORDER — FENTANYL CITRATE (PF) 100 MCG/2ML IJ SOLN
100.0000 ug | INTRAMUSCULAR | Status: DC | PRN
  Administered 2016-06-02 (×2): 100 ug via INTRAVENOUS
  Filled 2016-06-02 (×2): qty 2

## 2016-06-02 MED ORDER — LACTATED RINGERS IV SOLN: 500.0000 mL | Freq: Once | INTRAVENOUS | Status: DC

## 2016-06-02 MED ORDER — HYDROXYZINE HCL 50 MG PO TABS
25.0000 mg | ORAL_TABLET | Freq: Once | ORAL | Status: AC
  Administered 2016-06-02: 25 mg via ORAL
  Filled 2016-06-02: qty 1

## 2016-06-02 MED ORDER — SODIUM CHLORIDE 0.9 % IV SOLN
2.0000 g | Freq: Four times a day (QID) | INTRAVENOUS | Status: DC
  Administered 2016-06-02: 2 g via INTRAVENOUS
  Filled 2016-06-02 (×3): qty 2000

## 2016-06-02 MED ORDER — NICOTINE 21 MG/24HR TD PT24
21.0000 mg | MEDICATED_PATCH | Freq: Every day | TRANSDERMAL | Status: DC
  Administered 2016-06-02: 21 mg via TRANSDERMAL
  Filled 2016-06-02 (×3): qty 1

## 2016-06-02 MED ORDER — DIPHENHYDRAMINE HCL 50 MG/ML IJ SOLN: 12.5000 mg | INTRAMUSCULAR | Status: DC | PRN

## 2016-06-02 MED ORDER — DEXTROSE 5 % IV SOLN
160.0000 mg | Freq: Three times a day (TID) | INTRAVENOUS | Status: DC
  Administered 2016-06-03: 160 mg via INTRAVENOUS
  Filled 2016-06-02 (×2): qty 4

## 2016-06-02 MED ORDER — PHENYLEPHRINE 40 MCG/ML (10ML) SYRINGE FOR IV PUSH (FOR BLOOD PRESSURE SUPPORT)
80.0000 ug | PREFILLED_SYRINGE | INTRAVENOUS | Status: DC | PRN
  Administered 2016-06-02 (×2): 80 ug via INTRAVENOUS

## 2016-06-02 MED ORDER — LIDOCAINE HCL (PF) 1 % IJ SOLN
INTRAMUSCULAR | Status: DC | PRN
  Administered 2016-06-02 (×2): 6 mL

## 2016-06-02 NOTE — Progress Notes (Signed)
Patient ID: [name], female   DOB: 02-Jul-1991, 25 y.o.   MRN: [id number]  Comfortable w/ epidural  VSS, afeb FHR 120s, +accels, occ mi variables Ctx difficult to trace- q 3-5 mins w/ Pit @ 8614mu/min Cx unchanged (5/90/-2)  IUP@term  Latent phase, prob inadequate labor  IUPC placed- continue to increase Pit to achieve adequate MVUs  SHAW, KIMBERLY CNM 06/02/2016 5:30 AM

## 2016-06-02 NOTE — Progress Notes (Signed)
Patient ID: Michelle RetortHollie D Loden, female   DOB: [birth year], 25 y.o.   MRN: [id number]  Pt feeling ctx but they aren't very painful VSS, afeb FHR 135-140s, +accels, no decels, +SS Ctx q 4 mins w/ Pit @ 578mu/min Cx 5/90/-2; AROM for mod MSF  IUP@term  Latent phase  Hopeful that AROM will increase labor Anticipate SVD  Cam HaiSHAW, Damira Kem CNM 06/02/2016 12:09 AM

## 2016-06-02 NOTE — Progress Notes (Signed)
[name] is a 25 y.o. G1P0000 at 2644w1d admitted for induction of labor due to Post dates. Due date 05/25/16 . Subjective: [name] continues to be anxious about delivery. She and family are very concerned with the time that has passed. Pt is reassured that her and baby's safety are our primary concern.   Objective: BP 122/67   Pulse (!) 106   Temp 100.1 F (37.8 C) (Oral)   Resp 18   Ht 5\' 2"  (1.575 m)   Wt 220 lb (99.8 kg)   LMP 08/19/2015 (Approximate)   SpO2 100%   BMI 40.24 kg/m  I/O last 3 completed shifts: In: -  Out: 1250 [Urine:1250] No intake/output data recorded.  FHT:  Fetal Heart Rate A  Mode Fetal scalp electrode filed at 06/02/2016 2300  Baseline Rate (A) 160 bpm filed at 06/02/2016 2330  Variability 6-25 BPM filed at 06/02/2016 2330  Accelerations 10 x 10 filed at 06/02/2016 2330  Decelerations None filed at 06/02/2016 2330  Scalp Stimulation Positive filed at 06/02/2016 1603    UC:   regular, every 1-3 minutes SVE:   Dilation: 10 Effacement (%): 100 Station: 0 Exam by:: AThersa Salt. Schwarz RN   Labs: Lab Results  Component Value Date   WBC 17.6 (H) 06/01/2016   HGB 11.0 (L) 06/01/2016   HCT 32.4 (L) 06/01/2016   MCV 84.6 06/01/2016   PLT 340 06/01/2016    Assessment / Plan: Induction of labor due to postterm.  Labor: Progressing on pitocin Fetal Wellbeing:  Category II Pain Control:  Epidural I/D:  Pt had temp of 100.5 at 21:30, PCN discontinued and Amp/gent begun Anticipated MOD:  SVD  Andres Egericia Jawara Latorre, MD, PGY-1, MPH 06/02/2016, 11:58 PM

## 2016-06-02 NOTE — Progress Notes (Signed)
ANTIBIOTIC CONSULT NOTE - INITIAL  Pharmacy Consult for Gentamicin Indication: Chorioamnionitis   No Known Allergies  Patient Measurements: Height: 5\' 2"  (157.5 cm) Weight: 220 lb (99.8 kg) IBW/kg (Calculated) : 50.1 Adjusted Body Weight: 65  Vital Signs: Temp: 100.1 F (37.8 C) (08/10 2242) Temp Source: Oral (08/10 2242) BP: 122/67 (08/10 2330) Pulse Rate: 106 (08/10 2330)  Labs:  Recent Labs  06/01/16 0820  WBC 17.6*  HGB 11.0*  PLT 340   No results for input(s): GENTTROUGH, GENTPEAK, GENTRANDOM in the last 72 hours.   Microbiology: No results found for this or any previous visit (from the past 720 hour(s)).  Medications:    Assessment: 25 y.o. female G1P0000 at 8173w1d  Estimated Ke = 0.341, Vd = 26  Goal of Therapy:  Gentamicin peak 6-8 mg/L and Trough < 1 mg/L  Plan:   Gentamicin 160 mg IV every 8 hrs  Check Scr with next labs if gentamicin continued. Will check gentamicin levels if continued > 72hr or clinically indicated.  Drusilla KannerGrimsley, Kharizma Lesnick Lydia 06/02/2016,11:33 PM

## 2016-06-02 NOTE — Anesthesia Procedure Notes (Signed)
Epidural Patient location during procedure: OB  Staffing Anesthesiologist: Sherrian DiversENENNY, Deshaun Weisinger  Preanesthetic Checklist Completed: patient identified, site marked, surgical consent, pre-op evaluation, timeout performed, IV checked, risks and benefits discussed and monitors and equipment checked  Epidural Patient position: sitting Prep: DuraPrep Patient monitoring: blood pressure and heart rate Approach: midline Location: L4-L5 Injection technique: LOR saline  Needle:  Needle type: Tuohy  Needle gauge: 17 G Needle length: 9 cm Needle insertion depth: 6 cm Catheter type: closed end flexible Catheter size: 19 Gauge Catheter at skin depth: 14 cm Test dose: negative and Other  Assessment Events: blood not aspirated, injection not painful, no injection resistance, negative IV test and no paresthesia  Additional Notes Reason for block:procedure for pain

## 2016-06-02 NOTE — Progress Notes (Signed)
LABOR PROGRESS NOTE  Yitta D Vear Clockhillips is a 25 y.o. G1P0 at 8123w1d  admitted for IOL for post dates.  Subjective: Pt anxious to have baby now, asking for C section. Counseled her that a C section is major abdominal surgery and not necessary right now.  Objective: BP (!) 98/50   Pulse 85   Temp 98.8 F (37.1 C) (Oral)   Resp 20   Ht 5\' 2"  (1.575 m)   Wt 99.8 kg (220 lb)   LMP 08/19/2015 (Approximate)   SpO2 100%   BMI 40.24 kg/m  or  Vitals:   06/02/16 0801 06/02/16 0831 06/02/16 0901 06/02/16 0920  BP: (!) 120/59 (!) 113/35 (!) 98/50   Pulse: 90 89 85   Resp:  20 20 20   Temp:    98.8 F (37.1 C)  TempSrc:    Oral  SpO2:      Weight:      Height:        Dilation: 6.5 Effacement (%): 90 Cervical Position: Anterior Station: -2 Presentation: Vertex Exam by:: Enis SlipperJane Bailey, RN  Labs: Lab Results  Component Value Date   WBC 17.6 (H) 06/01/2016   HGB 11.0 (L) 06/01/2016   HCT 32.4 (L) 06/01/2016   MCV 84.6 06/01/2016   PLT 340 06/01/2016    Patient Active Problem List   Diagnosis Date Noted  . Post-dates pregnancy 06/01/2016  . Obesity in pregnancy 05/17/2016  . BMI 40.0-44.9, adult (HCC) 05/17/2016  . Tobacco abuse 05/17/2016  . GBS (group B Streptococcus carrier), +RV culture, currently pregnant 05/17/2016  . Marijuana abuse 05/17/2016  . Supervision of normal pregnancy in third trimester 12/04/2015    Assessment / Plan: 25 y.o. G1P0 at 8223w1d here for IOL for PDIOL.  #anxiety: trying a nicotene patch and vistaril  Labor: pit, IUPC in place Fetal Wellbeing:  Cat 1 Pain Control:  Epidural in place Anticipated MOD:  SVD  Loni MuseKate Avaline Stillson, MD 06/02/2016, 9:23 AM

## 2016-06-02 NOTE — Progress Notes (Signed)
[name] is a 25 y.o. G1P0 at 3154w1d  admitted for induction of labor due to Post dates. Due date 05/25/16.  Subjective: Pt having dysfunctional labor pattern, contractions irregular, MVU's suboptimal.150. FhR with occasional baseline change to 90-100 for 7-10 mins with good BTB,  FSE in place,now, and IUPC in place.  Objective: BP 120/72   Pulse 96   Temp 98.8 F (37.1 C) (Oral)   Resp 20   Ht 5\' 2"  (1.575 m)   Wt 99.8 kg (220 lb)   LMP 08/19/2015 (Approximate)   SpO2 100%   BMI 40.24 kg/m  No intake/output data recorded. No intake/output data recorded.  FHT:  FHR: 150 bpm, variability: moderate,  accelerations:  Present,  decelerations:  Present baseline change vs decel, wiht prompt recovery UC:   irregular, every 3-6 minutes SVE:   Dilation: 5.5 Effacement (%): 70, 80 Station: -1 Exam by:: [name], RNC Exam by [name] at 6:30 Labs: Lab Results  Component Value Date   WBC 17.6 (H) 06/01/2016   HGB 11.0 (L) 06/01/2016   HCT 32.4 (L) 06/01/2016   MCV 84.6 06/01/2016   PLT 340 06/01/2016    Assessment / Plan: Induction of labor due to postterm,  progressing well on pitocin  Labor: remains suboptimal. will increase oxytocin if fetus tolerates labor Preeclampsia:   Fetal Wellbeing:  Category I Pain Control:  Epidural I/D:  n/a Anticipated MOD:  NSVD  [name] 06/02/2016, 6:39 AM

## 2016-06-02 NOTE — Anesthesia Preprocedure Evaluation (Signed)
Anesthesia Evaluation  Patient identified by MRN, date of birth, ID band Patient awake    Reviewed: Allergy & Precautions, NPO status , Patient's Chart, lab work & pertinent test results  Airway Mallampati: II  TM Distance: >3 FB Neck ROM: Full    Dental no notable dental hx.    Pulmonary Current Smoker,    Pulmonary exam normal breath sounds clear to auscultation       Cardiovascular negative cardio ROS Normal cardiovascular exam Rhythm:Regular Rate:Normal     Neuro/Psych PSYCHIATRIC DISORDERS negative neurological ROS     GI/Hepatic negative GI ROS, Neg liver ROS,   Endo/Other  Morbid obesity  Renal/GU negative Renal ROS  negative genitourinary   Musculoskeletal negative musculoskeletal ROS (+)   Abdominal (+) + obese,   Peds negative pediatric ROS (+)  Hematology negative hematology ROS (+)   Anesthesia Other Findings   Reproductive/Obstetrics negative OB ROS                             Anesthesia Physical Anesthesia Plan  ASA: III  Anesthesia Plan: Epidural   Post-op Pain Management:    Induction: Intravenous  Airway Management Planned: Natural Airway  Additional Equipment:   Intra-op Plan:   Post-operative Plan:   Informed Consent: I have reviewed the patients History and Physical, chart, labs and discussed the procedure including the risks, benefits and alternatives for the proposed anesthesia with the patient or authorized representative who has indicated his/her understanding and acceptance.   Dental advisory given  Plan Discussed with: CRNA  Anesthesia Plan Comments: (Informed consent obtained prior to proceeding including risk of failure, 1% risk of PDPH, risk of minor discomfort and bruising.  Discussed rare but serious complications including epidural abscess, permanent nerve injury, epidural hematoma.  Discussed alternatives to epidural analgesia and patient  desires to proceed.  Timeout performed pre-procedure verifying patient name, procedure, and platelet count.  Patient tolerated procedure well.)        Anesthesia Quick Evaluation

## 2016-06-03 ENCOUNTER — Encounter (HOSPITAL_COMMUNITY)
Admission: RE | Disposition: A | Discharge status: Home / Self Care | Payer: Self-pay | Source: Ambulatory Visit | Attending: Obstetrics & Gynecology

## 2016-06-03 ENCOUNTER — Encounter (HOSPITAL_COMMUNITY): Payer: Self-pay

## 2016-06-03 DIAGNOSIS — O99324 Drug use complicating childbirth: Secondary | ICD-10-CM

## 2016-06-03 DIAGNOSIS — O480 Post-term pregnancy: Secondary | ICD-10-CM

## 2016-06-03 DIAGNOSIS — O99344 Other mental disorders complicating childbirth: Secondary | ICD-10-CM

## 2016-06-03 DIAGNOSIS — O324XX0 Maternal care for high head at term, not applicable or unspecified: Secondary | ICD-10-CM

## 2016-06-03 DIAGNOSIS — O99334 Smoking (tobacco) complicating childbirth: Secondary | ICD-10-CM

## 2016-06-03 DIAGNOSIS — O99214 Obesity complicating childbirth: Secondary | ICD-10-CM

## 2016-06-03 DIAGNOSIS — Z3A41 41 weeks gestation of pregnancy: Secondary | ICD-10-CM

## 2016-06-03 DIAGNOSIS — O99824 Streptococcus B carrier state complicating childbirth: Secondary | ICD-10-CM

## 2016-06-03 SURGERY — Surgical Case
Anesthesia: Epidural

## 2016-06-03 MED ORDER — OXYCODONE-ACETAMINOPHEN 5-325 MG PO TABS
2.0000 | ORAL_TABLET | ORAL | Status: DC | PRN
  Administered 2016-06-04 – 2016-06-05 (×4): 2 via ORAL
  Filled 2016-06-03 (×4): qty 2

## 2016-06-03 MED ORDER — KETOROLAC TROMETHAMINE 30 MG/ML IJ SOLN: 30.0000 mg | Freq: Four times a day (QID) | INTRAMUSCULAR | Status: AC | PRN

## 2016-06-03 MED ORDER — NALOXONE HCL 0.4 MG/ML IJ SOLN: 0.4000 mg | INTRAMUSCULAR | Status: DC | PRN

## 2016-06-03 MED ORDER — ONDANSETRON HCL 4 MG/2ML IJ SOLN: 4.0000 mg | Freq: Three times a day (TID) | INTRAMUSCULAR | Status: DC | PRN

## 2016-06-03 MED ORDER — IBUPROFEN 600 MG PO TABS
600.0000 mg | ORAL_TABLET | Freq: Four times a day (QID) | ORAL | Status: DC
  Administered 2016-06-03 – 2016-06-05 (×7): 600 mg via ORAL
  Filled 2016-06-03 (×7): qty 1

## 2016-06-03 MED ORDER — OXYTOCIN 10 UNIT/ML IJ SOLN
INTRAMUSCULAR | Status: DC | PRN
  Administered 2016-06-03: 40 [IU] via INTRAMUSCULAR

## 2016-06-03 MED ORDER — ONDANSETRON HCL 4 MG/2ML IJ SOLN
INTRAMUSCULAR | Status: AC
  Filled 2016-06-03: qty 2

## 2016-06-03 MED ORDER — SCOPOLAMINE 1 MG/3DAYS TD PT72
1.0000 | MEDICATED_PATCH | Freq: Once | TRANSDERMAL | Status: DC
  Filled 2016-06-03: qty 1

## 2016-06-03 MED ORDER — OXYTOCIN 10 UNIT/ML IJ SOLN
INTRAMUSCULAR | Status: AC
  Filled 2016-06-03: qty 4

## 2016-06-03 MED ORDER — NALBUPHINE HCL 10 MG/ML IJ SOLN: 5.0000 mg | Freq: Once | INTRAMUSCULAR | Status: DC | PRN

## 2016-06-03 MED ORDER — ACETAMINOPHEN 325 MG PO TABS
650.0000 mg | ORAL_TABLET | ORAL | Status: DC | PRN
Start: 2016-06-03 — End: 2016-06-05

## 2016-06-03 MED ORDER — OXYCODONE-ACETAMINOPHEN 5-325 MG PO TABS
1.0000 | ORAL_TABLET | ORAL | Status: DC | PRN
  Administered 2016-06-03 – 2016-06-04 (×2): 1 via ORAL
  Filled 2016-06-03 (×2): qty 1

## 2016-06-03 MED ORDER — DEXAMETHASONE SODIUM PHOSPHATE 4 MG/ML IJ SOLN
INTRAMUSCULAR | Status: DC | PRN
  Administered 2016-06-03: 4 mg via INTRAVENOUS

## 2016-06-03 MED ORDER — PHENYLEPHRINE 40 MCG/ML (10ML) SYRINGE FOR IV PUSH (FOR BLOOD PRESSURE SUPPORT)
PREFILLED_SYRINGE | INTRAVENOUS | Status: AC
  Filled 2016-06-03: qty 10

## 2016-06-03 MED ORDER — DIPHENHYDRAMINE HCL 50 MG/ML IJ SOLN: 12.5000 mg | INTRAMUSCULAR | Status: DC | PRN

## 2016-06-03 MED ORDER — MORPHINE SULFATE (PF) 0.5 MG/ML IJ SOLN
INTRAMUSCULAR | Status: DC | PRN
  Administered 2016-06-03: 3 mg via EPIDURAL

## 2016-06-03 MED ORDER — LIDOCAINE-EPINEPHRINE (PF) 2 %-1:200000 IJ SOLN
INTRAMUSCULAR | Status: AC
  Filled 2016-06-03: qty 20

## 2016-06-03 MED ORDER — DIPHENHYDRAMINE HCL 25 MG PO CAPS: 25.0000 mg | ORAL_CAPSULE | ORAL | Status: DC | PRN

## 2016-06-03 MED ORDER — FENTANYL CITRATE (PF) 100 MCG/2ML IJ SOLN
INTRAMUSCULAR | Status: AC
  Administered 2016-06-03: 50 ug via INTRAVENOUS
  Filled 2016-06-03: qty 2

## 2016-06-03 MED ORDER — SENNOSIDES-DOCUSATE SODIUM 8.6-50 MG PO TABS
2.0000 | ORAL_TABLET | ORAL | Status: DC
  Administered 2016-06-03 – 2016-06-05 (×2): 2 via ORAL
  Filled 2016-06-03 (×2): qty 2

## 2016-06-03 MED ORDER — MEPERIDINE HCL 25 MG/ML IJ SOLN: 6.2500 mg | INTRAMUSCULAR | Status: DC | PRN

## 2016-06-03 MED ORDER — MAGNESIUM HYDROXIDE 400 MG/5ML PO SUSP: 30.0000 mL | ORAL | Status: DC | PRN

## 2016-06-03 MED ORDER — NALBUPHINE HCL 10 MG/ML IJ SOLN: 5.0000 mg | INTRAMUSCULAR | Status: DC | PRN

## 2016-06-03 MED ORDER — SIMETHICONE 80 MG PO CHEW
80.0000 mg | CHEWABLE_TABLET | ORAL | Status: DC | PRN
  Administered 2016-06-04: 80 mg via ORAL
  Filled 2016-06-03: qty 1

## 2016-06-03 MED ORDER — MENTHOL 3 MG MT LOZG: 1.0000 | LOZENGE | OROMUCOSAL | Status: DC | PRN

## 2016-06-03 MED ORDER — FERROUS SULFATE 325 (65 FE) MG PO TABS
325.0000 mg | ORAL_TABLET | Freq: Two times a day (BID) | ORAL | Status: DC
  Administered 2016-06-03 – 2016-06-05 (×4): 325 mg via ORAL
  Filled 2016-06-03 (×4): qty 1

## 2016-06-03 MED ORDER — MORPHINE SULFATE (PF) 0.5 MG/ML IJ SOLN
INTRAMUSCULAR | Status: AC
  Filled 2016-06-03: qty 10

## 2016-06-03 MED ORDER — COCONUT OIL OIL
1.0000 "application " | TOPICAL_OIL | Status: DC | PRN
  Administered 2016-06-05: 1 via TOPICAL
  Filled 2016-06-03: qty 120

## 2016-06-03 MED ORDER — OXYTOCIN 40 UNITS IN LACTATED RINGERS INFUSION - SIMPLE MED: 2.5000 [IU]/h | INTRAVENOUS | Status: AC

## 2016-06-03 MED ORDER — MEPERIDINE HCL 25 MG/ML IJ SOLN
INTRAMUSCULAR | Status: AC
  Filled 2016-06-03: qty 1

## 2016-06-03 MED ORDER — SIMETHICONE 80 MG PO CHEW
80.0000 mg | CHEWABLE_TABLET | ORAL | Status: DC
  Administered 2016-06-03 – 2016-06-05 (×2): 80 mg via ORAL
  Filled 2016-06-03 (×2): qty 1

## 2016-06-03 MED ORDER — LACTATED RINGERS IV SOLN
INTRAVENOUS | Status: DC
  Administered 2016-06-03: 15:00:00 via INTRAVENOUS

## 2016-06-03 MED ORDER — LACTATED RINGERS IV SOLN: INTRAVENOUS | Status: DC

## 2016-06-03 MED ORDER — KETOROLAC TROMETHAMINE 30 MG/ML IJ SOLN
INTRAMUSCULAR | Status: AC
Start: 2016-06-03 — End: 2016-06-03
  Administered 2016-06-03: 30 mg via INTRAVENOUS
  Filled 2016-06-03: qty 1

## 2016-06-03 MED ORDER — BUPIVACAINE HCL (PF) 0.5 % IJ SOLN
INTRAMUSCULAR | Status: AC
  Filled 2016-06-03: qty 30

## 2016-06-03 MED ORDER — MEASLES, MUMPS & RUBELLA VAC ~~LOC~~ INJ
0.5000 mL | INJECTION | Freq: Once | SUBCUTANEOUS | Status: DC
  Filled 2016-06-03: qty 0.5

## 2016-06-03 MED ORDER — SCOPOLAMINE 1 MG/3DAYS TD PT72
MEDICATED_PATCH | TRANSDERMAL | Status: AC
  Filled 2016-06-03: qty 1

## 2016-06-03 MED ORDER — ZOLPIDEM TARTRATE 5 MG PO TABS: 5.0000 mg | ORAL_TABLET | Freq: Every evening | ORAL | Status: DC | PRN

## 2016-06-03 MED ORDER — ONDANSETRON HCL 4 MG/2ML IJ SOLN
INTRAMUSCULAR | Status: DC | PRN
  Administered 2016-06-03: 4 mg via INTRAVENOUS

## 2016-06-03 MED ORDER — DIPHENHYDRAMINE HCL 25 MG PO CAPS: 25.0000 mg | ORAL_CAPSULE | Freq: Four times a day (QID) | ORAL | Status: DC | PRN

## 2016-06-03 MED ORDER — SODIUM CHLORIDE 0.9 % IR SOLN
Status: DC | PRN
  Administered 2016-06-03: 1000 mL

## 2016-06-03 MED ORDER — SODIUM BICARBONATE 8.4 % IV SOLN
INTRAVENOUS | Status: DC | PRN
  Administered 2016-06-03 (×3): 5 mL via EPIDURAL

## 2016-06-03 MED ORDER — LACTATED RINGERS IV SOLN
INTRAVENOUS | Status: DC | PRN
  Administered 2016-06-03 (×2): via INTRAVENOUS

## 2016-06-03 MED ORDER — KETOROLAC TROMETHAMINE 30 MG/ML IJ SOLN
30.0000 mg | Freq: Four times a day (QID) | INTRAMUSCULAR | Status: AC | PRN
  Administered 2016-06-03: 30 mg via INTRAVENOUS

## 2016-06-03 MED ORDER — DEXAMETHASONE SODIUM PHOSPHATE 4 MG/ML IJ SOLN
INTRAMUSCULAR | Status: AC
  Filled 2016-06-03: qty 1

## 2016-06-03 MED ORDER — BUPIVACAINE HCL (PF) 0.5 % IJ SOLN
INTRAMUSCULAR | Status: DC | PRN
  Administered 2016-06-03: 30 mL

## 2016-06-03 MED ORDER — CEFAZOLIN SODIUM-DEXTROSE 2-3 GM-% IV SOLR
INTRAVENOUS | Status: DC | PRN
  Administered 2016-06-03: 2 g via INTRAVENOUS

## 2016-06-03 MED ORDER — TETANUS-DIPHTH-ACELL PERTUSSIS 5-2.5-18.5 LF-MCG/0.5 IM SUSP
0.5000 mL | Freq: Once | INTRAMUSCULAR | Status: AC
  Administered 2016-06-04: 0.5 mL via INTRAMUSCULAR
  Filled 2016-06-03: qty 0.5

## 2016-06-03 MED ORDER — PRENATAL MULTIVITAMIN CH
1.0000 | ORAL_TABLET | Freq: Every day | ORAL | Status: DC
  Administered 2016-06-04: 1 via ORAL
  Filled 2016-06-03: qty 1

## 2016-06-03 MED ORDER — NALOXONE HCL 2 MG/2ML IJ SOSY
1.0000 ug/kg/h | PREFILLED_SYRINGE | INTRAVENOUS | Status: DC | PRN
  Filled 2016-06-03: qty 2

## 2016-06-03 MED ORDER — SODIUM CHLORIDE 0.9% FLUSH: 3.0000 mL | INTRAVENOUS | Status: DC | PRN

## 2016-06-03 MED ORDER — FENTANYL CITRATE (PF) 100 MCG/2ML IJ SOLN
25.0000 ug | INTRAMUSCULAR | Status: DC | PRN
  Administered 2016-06-03 (×2): 25 ug via INTRAVENOUS
  Administered 2016-06-03: 50 ug via INTRAVENOUS

## 2016-06-03 MED ORDER — SCOPOLAMINE 1 MG/3DAYS TD PT72
MEDICATED_PATCH | TRANSDERMAL | Status: DC | PRN
  Administered 2016-06-03: 1 via TRANSDERMAL

## 2016-06-03 SURGICAL SUPPLY — 36 items
APL SKNCLS STERI-STRIP NONHPOA (GAUZE/BANDAGES/DRESSINGS) ×1
BENZOIN TINCTURE PRP APPL 2/3 (GAUZE/BANDAGES/DRESSINGS) ×3 IMPLANT
CHLORAPREP W/TINT 26ML (MISCELLANEOUS) ×3 IMPLANT
CLAMP CORD UMBIL (MISCELLANEOUS) ×3 IMPLANT
CLOSURE WOUND 1/2 X4 (GAUZE/BANDAGES/DRESSINGS) ×1
CLOTH BEACON ORANGE TIMEOUT ST (SAFETY) ×3 IMPLANT
DRSG OPSITE POSTOP 4X10 (GAUZE/BANDAGES/DRESSINGS) ×3 IMPLANT
ELECT REM PT RETURN 9FT ADLT (ELECTROSURGICAL) ×3
ELECTRODE REM PT RTRN 9FT ADLT (ELECTROSURGICAL) ×1 IMPLANT
EXTRACTOR VACUUM M CUP 4 TUBE (SUCTIONS) IMPLANT
EXTRACTOR VACUUM M CUP 4' TUBE (SUCTIONS)
GLOVE BIOGEL PI IND STRL 7.0 (GLOVE) ×3 IMPLANT
GLOVE BIOGEL PI INDICATOR 7.0 (GLOVE) ×6
GLOVE ECLIPSE 7.0 STRL STRAW (GLOVE) ×3 IMPLANT
GOWN STRL REUS W/TWL LRG LVL3 (GOWN DISPOSABLE) ×6 IMPLANT
KIT ABG SYR 3ML LUER SLIP (SYRINGE) IMPLANT
NEEDLE HYPO 22GX1.5 SAFETY (NEEDLE) ×3 IMPLANT
NEEDLE HYPO 25X5/8 SAFETYGLIDE (NEEDLE) ×3 IMPLANT
NS IRRIG 1000ML POUR BTL (IV SOLUTION) ×3 IMPLANT
PACK C SECTION WH (CUSTOM PROCEDURE TRAY) ×3 IMPLANT
PAD ABD 7.5X8 STRL (GAUZE/BANDAGES/DRESSINGS) ×3 IMPLANT
PAD ABD 8X7 1/2 STERILE (GAUZE/BANDAGES/DRESSINGS) ×3 IMPLANT
PAD OB MATERNITY 4.3X12.25 (PERSONAL CARE ITEMS) ×3 IMPLANT
PENCIL SMOKE EVAC W/HOLSTER (ELECTROSURGICAL) ×3 IMPLANT
RTRCTR C-SECT PINK 25CM LRG (MISCELLANEOUS) ×3 IMPLANT
SPONGE GAUZE 4X4 12PLY STER LF (GAUZE/BANDAGES/DRESSINGS) ×6 IMPLANT
STRIP CLOSURE SKIN 1/2X4 (GAUZE/BANDAGES/DRESSINGS) ×2 IMPLANT
SUT PDS AB 0 CTX 36 PDP370T (SUTURE) ×3 IMPLANT
SUT PLAIN 2 0 XLH (SUTURE) ×3 IMPLANT
SUT VIC AB 0 CTX 36 (SUTURE) ×9
SUT VIC AB 0 CTX36XBRD ANBCTRL (SUTURE) ×3 IMPLANT
SUT VIC AB 4-0 KS 27 (SUTURE) ×3 IMPLANT
SYR CONTROL 10ML LL (SYRINGE) ×3 IMPLANT
TAPE CLOTH SURG 4X10 WHT LF (GAUZE/BANDAGES/DRESSINGS) ×3 IMPLANT
TOWEL OR 17X24 6PK STRL BLUE (TOWEL DISPOSABLE) ×3 IMPLANT
TRAY FOLEY CATH SILVER 14FR (SET/KITS/TRAYS/PACK) ×3 IMPLANT

## 2016-06-03 NOTE — Op Note (Signed)
Ginevra D Tolin PROCEDURE DATE: 06/03/2016  PREOPERATIVE DIAGNOSES: Intrauterine pregnancy at [redacted]w[redacted]d weeks gestation; failure to progress: arrest of descent  POSTOPERATIVE DIAGNOSES: The same  PROCEDURE: Primary Low Transverse Cesarean Section  SURGEON:  Dr. Jaynie Collins  ASSISTANT: Dr. Jama Flavors  ANESTHESIOLOGIST: Dr. Saddie Benders  INDICATIONS: [name] is a 25 y.o. G1P0000 at [redacted]w[redacted]d here for primary cesarean section secondary to the indications listed under preoperative diagnoses; please see preoperative note for further details.  The risks of cesarean section were discussed with the patient including but were not limited to: bleeding which may require transfusion or reoperation; infection which may require antibiotics; injury to bowel, bladder, ureters or other surrounding organs; injury to the fetus; need for additional procedures including hysterectomy in the event of a life-threatening hemorrhage; placental abnormalities wth subsequent pregnancies, incisional problems, thromboembolic phenomenon and other postoperative/anesthesia complications.   The patient concurred with the proposed plan, giving informed written consent for the procedure.    FINDINGS:  Viable female infant in cephalic presentation.  Apgars 8 and 9.  Clear amniotic fluid.  Intact placenta, three vessel cord.  Normal uterus, fallopian tubes and ovaries bilaterally.  ANESTHESIA: Epidural INTRAVENOUS FLUIDS: 1500 ml ESTIMATED BLOOD LOSS: 800 ml URINE OUTPUT:  300 ml SPECIMENS: Placenta sent to pathology COMPLICATIONS: None immediate  PROCEDURE IN DETAIL:  The patient preoperatively received intravenous antibiotics and had sequential compression devices applied to her lower extremities.  She was then taken to the operating room where the epidural anesthesia was dosed up to surgical level and was found to be adequate. She was then placed in a dorsal supine position with a leftward tilt, and prepped and  draped in a sterile manner.  A foley catheter was placed into her bladder and attached to constant gravity.  After an adequate timeout was performed, a Pfannenstiel skin incision was made with scalpel and carried through to the underlying layer of fascia. The fascia was incised in the midline, and this incision was extended bilaterally using the Mayo scissors.  Kocher clamps were applied to the superior aspect of the fascial incision and the underlying rectus muscles were dissected off bluntly.  A similar process was carried out on the inferior aspect of the fascial incision. The rectus muscles were separated in the midline bluntly and the peritoneum was entered bluntly. Attention was turned to the lower uterine segment where a low transverse hysterotomy was made with a scalpel and extended bilaterally bluntly.  The infant was successfully delivered, the cord was clamped and cut after one minute, and the infant was handed over to the awaiting neonatology team. Uterine massage was then administered, and the placenta delivered intact with a three-vessel cord. The uterus was then cleared of clot and debris.  The hysterotomy was closed with 0 Vicryl in a running locked fashion, and an imbricating layer was also placed with 0 Vicryl.  The pelvis was cleared of all clot and debris. Hemostasis was confirmed on all surfaces.  The peritoneum and the muscles were reapproximated using 0 Vicryl interrupted stitches. The fascia was then closed using 0 PDS in a running fashion.  The subcutaneous layer was irrigated, then reapproximated with 2-0 plain gut interrupted stitches, and 30 ml of 0.5% Marcaine was injected subcutaneously around the incision.  The skin was closed with a 4-0 Vicryl subcuticular stitch. The patient tolerated the procedure well. Sponge, lap, instrument and needle counts were correct x 3.  She was taken to the recovery room in stable condition.  Jaynie CollinsUGONNA  Dorothe Elmore, MD, FACOG Attending Obstetrician &  Gynecologist Faculty Practice, [facility]

## 2016-06-03 NOTE — Progress Notes (Signed)
Subjective: Postpartum Day #0: Cesarean Delivery early this AM for arrest of station Patient reports incisional pain.  Attempting to breastfeed.    Objective: Vital signs in last 24 hours: Temp:  [98.8 F (37.1 C)-100.5 F (38.1 C)] 99.6 F (37.6 C) (08/11 0730) Pulse Rate:  [79-135] 82 (08/11 0730) Resp:  [16-22] 20 (08/11 0730) BP: (83-162)/(35-103) 130/76 (08/11 0730) SpO2:  [94 %-98 %] 94 % (08/11 0714)  Physical Exam:  General: alert, cooperative, no distress and moderately obese Lochia: appropriate Uterine Fundus: firm Incision: no significant drainage DVT Evaluation: No evidence of DVT seen on physical exam. No cords or calf tenderness. SCD's on.  Foley draining clear yellow.     Recent Labs  06/01/16 0820  HGB 11.0*  HCT 32.4*    Assessment/Plan: Status post Cesarean section. Doing well postoperatively.  Continue current care.  [name], CNM 06/03/2016, 8:16 AM

## 2016-06-03 NOTE — Anesthesia Postprocedure Evaluation (Signed)
Anesthesia Post Note  Patient: Michelle RetortHollie D Lacina  Procedure(s) Performed: Procedure(s) (LRB): CESAREAN SECTION (N/A)  Patient location during evaluation: Mother Baby Anesthesia Type: Epidural Level of consciousness: awake, awake and alert, oriented and patient cooperative Pain management: pain level controlled Vital Signs Assessment: post-procedure vital signs reviewed and stable Respiratory status: spontaneous breathing, nonlabored ventilation and respiratory function stable Cardiovascular status: stable Postop Assessment: no headache, no backache, no signs of nausea or vomiting and patient able to bend at knees Anesthetic complications: no     Last Vitals:  Vitals:   06/03/16 0845 06/03/16 0945  BP: 123/73 131/71  Pulse: 65 66  Resp: 18 18  Temp: 37.1 C 37.2 C    Last Pain:  Vitals:   06/03/16 1045  TempSrc:   PainSc: 4    Pain Goal: Patients Stated Pain Goal: 3 (06/03/16 0645)               Nasia Cannan L

## 2016-06-03 NOTE — Progress Notes (Addendum)
[name] is a 25 y.o. G1P0000 at 6463w1d admitted for induction of Michelle Clocklabor due to Post dates. Due date 05/25/16 . Subjective: Ms. [name] continues to be anxious about delivery. She and family are very concerned with the time that has passed. Pt is reassured that her and baby's safety are our primary concern.   Objective: BP 122/67   Pulse (!) 106   Temp 100.1 F (37.8 C) (Oral)   Resp 18   Ht 5\' 2"  (1.575 m)   Wt 220 lb (99.8 kg)   LMP 08/19/2015 (Approximate)   SpO2 100%   BMI 40.24 kg/m  I/O last 3 completed shifts: In: -  Out: 1250 [Urine:1250] No intake/outHollie D [name] is a 25 y.o. G1P0000 at 4663w1d admitted for induction of labor due to Post dates. Due date 05/25/16 . Subjective: Ms. [name] has attempted to push for for >3 hours. She attempted to rest and start pushing again with out any progression of station.   Objective: BP 122/67   Pulse (!) 106   Temp 100.1 F (37.8 C) (Oral)   Resp 18   Ht 5\' 2"  (1.575 m)   Wt 220 lb (99.8 kg)   LMP 08/19/2015 (Approximate)   SpO2 100%   BMI 40.24 kg/m  I/O last 3 completed shifts: In: -  Out: 1250 [Urine:1250] No intake/output data recorded.  FHT:     Fetal Heart Rate A  Mode Fetal scalp electrode filed at 06/02/2016 2300  Baseline Rate (A) 160 bpm filed at 06/02/2016 2330  Variability 6-25 BPM filed at 06/02/2016 2330  Accelerations 10 x 10 filed at 06/02/2016 2330  Decelerations None filed at 06/02/2016 2330  Scalp Stimulation Positive filed at 06/02/2016 1603    UC:   regular, every 1-3 minutes SVE:   Dilation: 10 Effacement (%): 100 Station: 0 Exam by:: AThersa Salt. Schwarz RN   Labs: Recent Labs       Lab Results  Component Value Date   WBC 17.6 (H) 06/01/2016   HGB 11.0 (L) 06/01/2016   HCT 32.4 (L) 06/01/2016   MCV 84.6 06/01/2016   PLT 340 06/01/2016      Assessment / Plan: Induction of labor due to postterm. put data recorded.  FHT:     Fetal Heart Rate A  Mode Fetal  scalp electrode filed at 06/02/2016 2300  Baseline Rate (A) 160 bpm filed at 06/02/2016 2330  Variability 6-25 BPM filed at 06/02/2016 2330  Accelerations 10 x 10 filed at 06/02/2016 2330  Decelerations None filed at 06/02/2016 2330  Scalp Stimulation Positive filed at 06/02/2016 1603    UC:   regular, every 1-3 minutes SVE:   Dilation: 10 Effacement (%): 100 Station: 0 Exam by:: AThersa Salt. Schwarz RN   Labs: Recent Labs       Lab Results  Component Value Date   WBC 17.6 (H) 06/01/2016   HGB 11.0 (L) 06/01/2016   HCT 32.4 (L) 06/01/2016   MCV 84.6 06/01/2016   PLT 340 06/01/2016      Assessment / Plan: Induction of labor due to postterm.  D/W pt options including continued labor but poor prognosis of vaginal delivery. Pt is agree able to c-section which is indicated for arrest of descent.   Lorne SkeensNicholas Michael Schenk, MD   Attestation of Attending Supervision of Obstetric Fellow: Evaluation and management procedures were performed by the Obstetric Fellow under my supervision and collaboration.  I have reviewed the Obstetric Fellow's note and chart, and I agree with the management and  plan.  The risks of cesarean section discussed with the patient included but were not limited to: bleeding which may require transfusion or reoperation; infection which may require antibiotics; injury to bowel, bladder, ureters or other surrounding organs; injury to the fetus; need for additional procedures including hysterectomy in the event of a life-threatening hemorrhage; placental abnormalities wth subsequent pregnancies, incisional problems, thromboembolic phenomenon and other postoperative/anesthesia complications. The patient concurred with the proposed plan, giving informed written consent for the procedure.   Anesthesia and OR aware. Preoperative prophylactic antibiotics and SCDs ordered on call to the OR.  To OR when ready.   Jaynie Collins, MD, FACOG Attending Obstetrician &  Gynecologist Faculty Practice, [facility]

## 2016-06-03 NOTE — Transfer of Care (Addendum)
Immediate Anesthesia Transfer of Care Note  Patient: Noberto RetortHollie D Blash  Procedure(s) Performed: Procedure(s): CESAREAN SECTION (N/A)  Patient Location: PACU  Anesthesia Type:Epidural  Level of Consciousness: awake, alert , oriented and patient cooperative  Airway & Oxygen Therapy: Patient Spontanous Breathing  Post-op Assessment: Report given to RN and Post -op Vital signs reviewed and stable  Post vital signs: Reviewed and stable   Last Vitals: TEMP 99.0 BP 137/90 HR 96 RR 22 POX 98   Pain level: 0 Pain goal: 4          Complications: No apparent anesthesia complications

## 2016-06-03 NOTE — Lactation Note (Signed)
This note was copied from a baby's chart. Lactation Consultation Note Initial visit Baby now 8 hours. Mom reports she fed about 2 hours ago for 10 min. Reports no pain with latch. Mom easily able to hand express Colostrum from both breasts. Baby spitting up mucous. Did not attempt to latch now. Skin to skin with mom. Reviewed feeding cues and encouraged to feed whenever she sees them BF brochure given. Reviewed our phone number, OP appointments and BFSG as resources for support after DC. No questions at present. To call for assist prn  Patient Name: [name] UJWJX'BToday's Date: 06/03/2016 Reason for consult: Initial assessment   Maternal Data Formula Feeding for Exclusion: Yes Reason for exclusion: Mother's choice to formula and breast feed on admission Has patient been taught Hand Expression?: Yes Does the patient have breastfeeding experience prior to this delivery?: No  Feeding    LATCH Score/Interventions                      Lactation Tools Discussed/Used     Consult Status Consult Status: Follow-up Date: 06/04/16 Follow-up type: In-patient    Pamelia HoitWeeks, Alanee Ting D 06/03/2016, 1:47 PM

## 2016-06-04 LAB — CBC
HCT: 27.3 % — ABNORMAL LOW (ref 36.0–46.0)
Hemoglobin: 9 g/dL — ABNORMAL LOW (ref 12.0–15.0)
MCH: 29.3 pg (ref 26.0–34.0)
MCHC: 33 g/dL (ref 30.0–36.0)
MCV: 88.9 fL (ref 78.0–100.0)
Platelets: 271 10*3/uL (ref 150–400)
RBC: 3.07 MIL/uL — ABNORMAL LOW (ref 3.87–5.11)
RDW: 13.3 % (ref 11.5–15.5)
WBC: 13.9 10*3/uL — ABNORMAL HIGH (ref 4.0–10.5)

## 2016-06-04 MED ORDER — PENICILLIN G POTASSIUM 5000000 UNITS IJ SOLR: 2.5000 10*6.[IU] | INTRAVENOUS | Status: DC

## 2016-06-04 MED ORDER — WITCH HAZEL-GLYCERIN EX PADS: 1.0000 "application " | MEDICATED_PAD | CUTANEOUS | Status: DC | PRN

## 2016-06-04 MED ORDER — PNEUMOCOCCAL VAC POLYVALENT 25 MCG/0.5ML IJ INJ
0.5000 mL | INJECTION | INTRAMUSCULAR | Status: AC
Start: 2016-06-05 — End: 2016-06-05
  Administered 2016-06-05: 0.5 mL via INTRAMUSCULAR
  Filled 2016-06-04: qty 0.5

## 2016-06-04 MED ORDER — DIBUCAINE 1 % RE OINT: 1.0000 "application " | TOPICAL_OINTMENT | RECTAL | Status: DC | PRN

## 2016-06-04 MED ORDER — PENICILLIN G POTASSIUM 5000000 UNITS IJ SOLR: 5.0000 10*6.[IU] | Freq: Once | INTRAVENOUS | Status: DC

## 2016-06-04 NOTE — Plan of Care (Signed)
Problem: Urinary Elimination: Goal: Ability to reestablish a normal urinary elimination pattern will improve Outcome: Completed/Met Date Met: 06/04/16 Voiding without any difficulty.

## 2016-06-04 NOTE — Clinical Social Work Maternal (Signed)
  CLINICAL SOCIAL WORK MATERNAL/CHILD NOTE  Patient Details  Name: [name] MRN: [id number] Date of Birth: 11-Jun-1991  Date:  06/04/2016  Clinical Social Worker Initiating Note:   (Valgene Deloatch lcsw) Date/ Time Initiated:  06/04/16/      Child's Name:   (unknown)   Legal Guardian:  Mother   Need for Interpreter:  None   Date of Referral:  06/03/16     Reason for Referral:  Current Substance Use/Substance Use During Pregnancy    Referral Source:  Central Nursery   Address:    [address] [place] [address] Phone number:    [phone]  Household Members:  Significant Other   Natural Supports (not living in the home):  Extended Family   Professional Supports: None   Employment: Full-time   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  Pt granted CSW permission to conduct discussion with family in the room.  Pt informed of hospital's drug screening policy and that 2 drug screens would be done on the baby.  Pt indicated that she understood the need for the policy/procedure, but that she wasn't worried because she only smoked one time during her pregnancy.  Pt informed that the baby's UDS screen was negative, but that we would be following for results of the cord testing.  Futhermore, if the cord test came back positive, then CPS would be contacted and a report would be made.  Pt indicated that she understood and remained cooperative of plan.  Pt describes a  supportive relationship between her and FOB, as well as an extended supportive family "on both sides."  Pt has all necessary supplies/equipment for baby and is excited for d/c so she can take baby home.  Pt denies any hx of MH issues/concerns.  PPD discussed and pt agreeable to f/u with her MD for any changes in mood/behavior.  Support group list provided.  P/familyt appreciative of social work visit.  No other social work needs identified.  CSW will follow for cord test results and f/u accordingly.  CSW Plan/Description:  Psychosocial Support and Ongoing Assessment of Needs    Roanna Raider, LCSW 06/04/2016, 2:55 PM

## 2016-06-04 NOTE — Progress Notes (Signed)
Post Op Day 1 from [facility]  Subjective: no complaints, up ad lib, voiding, tolerating PO and + flatus . Reports breast feeding going well.  Objective: Blood pressure 121/62, pulse 84, temperature 98.5 F (36.9 C), temperature source Oral, resp. rate 18, height 5\' 2"  (1.575 m), weight 220 lb (99.8 kg), last menstrual period 08/19/2015, SpO2 98 %, unknown if currently breastfeeding.  Physical Exam:  General: alert, cooperative and no distress Lochia: appropriate Uterine Fundus: firm Incision: healing well, no significant drainage, no significant erythema DVT Evaluation: No evidence of DVT seen on physical exam.   Recent Labs  06/01/16 0820 06/04/16 0641  HGB 11.0* 9.0*  HCT 32.4* 27.3*    Assessment/Plan: Plan for discharge tomorrow. Breastfeeding and ambulation today   LOS: 3 days   Andres Egericia Brein, MD, PGY-1, MPH 06/04/2016, 7:12 AM    OB FELLOW POSTPARTUM PROGRESS NOTE ATTESTATION  I have seen and examined this patient and agree with above documentation in the resident's note.   Jen MowElizabeth Donis Pinder, DO

## 2016-06-05 MED ORDER — FUROSEMIDE 40 MG PO TABS
40.0000 mg | ORAL_TABLET | Freq: Once | ORAL | Status: AC
  Administered 2016-06-05: 40 mg via ORAL
  Filled 2016-06-05: qty 1

## 2016-06-05 MED ORDER — OXYCODONE-ACETAMINOPHEN 5-325 MG PO TABS: 1.0000 | ORAL_TABLET | ORAL | 0 refills | Status: DC | PRN

## 2016-06-05 MED ORDER — SENNOSIDES-DOCUSATE SODIUM 8.6-50 MG PO TABS: 2.0000 | ORAL_TABLET | ORAL | 0 refills | Status: DC

## 2016-06-05 MED ORDER — IBUPROFEN 600 MG PO TABS: 600.0000 mg | ORAL_TABLET | Freq: Four times a day (QID) | ORAL | 0 refills | Status: DC

## 2016-06-05 NOTE — Discharge Instructions (Signed)
Cesarean Delivery, Care After  Refer to this sheet in the next few weeks. These instructions provide you with information on caring for yourself after your procedure. Your health care provider may also give you specific instructions. Your treatment has been planned according to current medical practices, but problems sometimes occur. Call your health care provider if you have any problems or questions after you go home.  HOME CARE INSTRUCTIONS   Only take over-the-counter or prescription medications as directed by your health care provider.   Do not drink alcohol, especially if you are breastfeeding or taking medication to relieve pain.   Do not chew or smoke tobacco.   Continue to use good perineal care. Good perineal care includes:    Wiping your perineum from front to back.    Keeping your perineum clean.   Check your surgical cut (incision) daily for increased redness, drainage, swelling, or separation of skin.   Clean your incision gently with soap and water every day, and then pat it dry. If your health care provider says it is okay, leave the incision uncovered. Use a bandage (dressing) if the incision is draining fluid or appears irritated. If the adhesive strips across the incision do not fall off within 7 days, carefully peel them off.   Hug a pillow when coughing or sneezing until your incision is healed. This helps to relieve pain.   Do not use tampons or douche until your health care provider says it is okay.   Shower, wash your hair, and take tub baths as directed by your health care provider.   Wear a well-fitting bra that provides breast support.   Limit wearing support panties or control-top hose.   Drink enough fluids to keep your urine clear or pale yellow.   Eat high-fiber foods such as whole grain cereals and breads, brown rice, beans, and fresh fruits and vegetables every day. These foods may help prevent or relieve constipation.   Resume activities such as climbing stairs,  driving, lifting, exercising, or traveling as directed by your health care provider.   Talk to your health care provider about resuming sexual activities. This is dependent upon your risk of infection, your rate of healing, and your comfort and desire to resume sexual activity.   Try to have someone help you with your household activities and your newborn for at least a few days after you leave the hospital.   Rest as much as possible. Try to rest or take a nap when your newborn is sleeping.   Increase your activities gradually.   Keep all of your scheduled postpartum appointments. It is very important to keep your scheduled follow-up appointments. At these appointments, your health care provider will be checking to make sure that you are healing physically and emotionally.  SEEK MEDICAL CARE IF:    You are passing large clots from your vagina. Save any clots to show your health care provider.   You have a foul smelling discharge from your vagina.   You have trouble urinating.   You are urinating frequently.   You have pain when you urinate.   You have a change in your bowel movements.   You have increasing redness, pain, or swelling near your incision.   You have pus draining from your incision.   Your incision is separating.   You have painful, hard, or reddened breasts.   You have a severe headache.   You have blurred vision or see spots.   You feel sad   or depressed.   You have thoughts of hurting yourself or your newborn.   You have questions about your care, the care of your newborn, or medications.   You are dizzy or light-headed.   You have a rash.   You have pain, redness, or swelling at the site of the removed intravenous access (IV) tube.   You have nausea or vomiting.   You stopped breastfeeding and have not had a menstrual period within 12 weeks of stopping.   You are not breastfeeding and have not had a menstrual period within 12 weeks of delivery.   You have a fever.  SEEK  IMMEDIATE MEDICAL CARE IF:   You have persistent pain.   You have chest pain.   You have shortness of breath.   You faint.   You have leg pain.   You have stomach pain.   Your vaginal bleeding saturates 2 or more sanitary pads in 1 hour.  MAKE SURE YOU:    Understand these instructions.   Will watch your condition.   Will get help right away if you are not doing well or get worse.     This information is not intended to replace advice given to you by your health care provider. Make sure you discuss any questions you have with your health care provider.     Document Released: 07/02/2002 Document Revised: 10/31/2014 Document Reviewed: 06/06/2012  Elsevier Interactive Patient Education 2016 [employer]

## 2016-06-05 NOTE — Lactation Note (Signed)
This note was copied from a baby's chart. Lactation Consultation Note  Patient Name: [name] ZOXWR'UToday's Date: 06/05/2016  Follow up visit made prior to discharge.  Mom is both breast and formula feeding by choice.  Instructed to always put baby to breast first to establish milk supply.  Discussed milk coming to volume. Mom denies questions or concerns and states baby is latching easily.  Lactation outpatient services and support reviewed and encouraged.   Maternal Data    Feeding    LATCH Score/Interventions                      Lactation Tools Discussed/Used     Consult Status      Huston FoleyMOULDEN, Manroop Jakubowicz S 06/05/2016, 10:12 AM

## 2016-06-05 NOTE — Discharge Summary (Signed)
OB Discharge Summary     Patient Name: Michelle RetortHollie D Ryan DOB: 04/23/1991 MRN: [id number]  Date of admission: 06/01/2016 Delivering MD: Jaynie CollinsANYANWU, UGONNA A   Date of discharge: 06/05/2016  Admitting diagnosis: INDUCTION Intrauterine pregnancy: 1546w2d     Secondary diagnosis:  Active Problems:   S/P cesarean section for arrest of descent   BMI 40.0-44.9, adult (HCC)   GBS (group B Streptococcus carrier), +RV culture, currently pregnant   Post-dates pregnancy  Additional problems: None     Discharge diagnosis: Term Pregnancy Delivered                                                                                                Post partum procedures:None  Augmentation: AROM, Pitocin and Cytotec  Complications: None  Hospital course:  Induction of Labor With Cesarean Section  25 y.o. yo G1P1001 at 6746w2d was admitted to the hospital 06/01/2016 for induction of labor. Patient had a labor course significant for IOL with cytotec, pitocin, AROM. The patient went for cesarean section due to Arrest of Descent, and delivered a Viable infant,@BABYSUPPRESS (DBLINK,ept,110,,1,,) Membrane Rupture Time/Date: )12:03 AM ,06/02/2016   @Details  of operation can be found in separate operative Note.  Patient had an uncomplicated postpartum course. She is ambulating, tolerating a regular diet, passing flatus, and urinating well.  Patient is discharged home in stable condition on 06/05/16.                                     Physical exam Vitals:   06/04/16 0100 06/04/16 0520 06/04/16 1758 06/05/16 0621  BP: (!) 121/53 121/62 135/77 121/69  Pulse: 73 84 80 70  Resp: 18 18 16 18   Temp: 98.9 F (37.2 C) 98.5 F (36.9 C) 98.5 F (36.9 C) 98 F (36.7 C)  TempSrc: Oral Oral Oral   SpO2:      Weight:      Height:       General: alert, cooperative and no distress Lochia: appropriate Uterine Fundus: firm Incision: Healing well with no significant drainage, No significant erythema DVT Evaluation: No  evidence of DVT seen on physical exam. Negative Homan's sign. No cords or calf tenderness. Calf/Ankle edema is present Labs: Lab Results  Component Value Date   WBC 13.9 (H) 06/04/2016   HGB 9.0 (L) 06/04/2016   HCT 27.3 (L) 06/04/2016   MCV 88.9 06/04/2016   PLT 271 06/04/2016   CMP Latest Ref Rng & Units 10/13/2014  Glucose 70 - 99 mg/dL 409(W129(H)  BUN 6 - 23 mg/dL 5(L)  Creatinine 1.190.50 - 1.10 mg/dL 1.470.69  Sodium 829137 - 562147 mEq/L 138  Potassium 3.7 - 5.3 mEq/L 3.3(L)  Chloride 96 - 112 mEq/L 101  CO2 19 - 32 mEq/L 20  Calcium 8.4 - 10.5 mg/dL 8.8  Total Protein 6.0 - 8.3 g/dL 6.7  Total Bilirubin 0.3 - 1.2 mg/dL 0.6  Alkaline Phos 39 - 117 U/L 101  AST 0 - 37 U/L 15  ALT 0 - 35 U/L 19    Discharge  instruction: per After Visit Summary and "Baby and Me Booklet".  After visit meds:    Medication List    STOP taking these medications   cyclobenzaprine 10 MG tablet Commonly known as:  FLEXERIL   Oxycodone HCl 10 MG Tabs     TAKE these medications   acetaminophen 325 MG tablet Commonly known as:  TYLENOL Take 650 mg by mouth every 6 (six) hours as needed for moderate pain.   ibuprofen 600 MG tablet Commonly known as:  ADVIL,MOTRIN Take 1 tablet (600 mg total) by mouth every 6 (six) hours.   loratadine 10 MG tablet Commonly known as:  CLARITIN Take 1 tablet (10 mg total) by mouth daily.   omeprazole 20 MG capsule Commonly known as:  PRILOSEC Take 1 capsule (20 mg total) by mouth 2 (two) times daily before a meal.   oxyCODONE-acetaminophen 5-325 MG tablet Commonly known as:  PERCOCET/ROXICET Take 1 tablet by mouth every 4 (four) hours as needed for moderate pain or severe pain.   senna-docusate 8.6-50 MG tablet Commonly known as:  Senokot-S Take 2 tablets by mouth daily.   VITAFOL GUMMIES 3.33-0.333-34.8 MG Chew Chew 3 tablets by mouth daily.       Diet: routine diet  Activity: Advance as tolerated. Pelvic rest for 6 weeks.   Outpatient follow up:6  weeks Follow up Appt:Future Appointments Date Time Provider Department Center  07/10/2016 1:15 PM Silvis Bing, MD [facility]   Follow up Visit:No Follow-up on file.  Postpartum contraception: Nexplanon  Newborn Data: Live born female  Birth Weight: 7 lb 11.1 oz (3490 g) APGAR: 8, 9  Baby Feeding: Bottle and Breast Disposition:home with mother   06/05/2016 Jen Mow, DO

## 2016-06-08 ENCOUNTER — Telehealth: Payer: Self-pay | Admitting: *Deleted

## 2016-06-08 NOTE — Telephone Encounter (Signed)
Patient aware needs a 2 week postpartum f/u in order to schedule her Nexplanon insertion.Call sent to front check in to schedule visit.

## 2016-06-23 ENCOUNTER — Ambulatory Visit (INDEPENDENT_AMBULATORY_CARE_PROVIDER_SITE_OTHER): Payer: Medicaid Other | Admitting: Obstetrics and Gynecology

## 2016-06-23 DIAGNOSIS — Z30017 Encounter for initial prescription of implantable subdermal contraceptive: Secondary | ICD-10-CM

## 2016-06-23 DIAGNOSIS — Z3049 Encounter for surveillance of other contraceptives: Secondary | ICD-10-CM | POA: Diagnosis not present

## 2016-06-23 DIAGNOSIS — Z3202 Encounter for pregnancy test, result negative: Secondary | ICD-10-CM

## 2016-06-23 DIAGNOSIS — Z30019 Encounter for initial prescription of contraceptives, unspecified: Secondary | ICD-10-CM

## 2016-06-23 LAB — POCT URINE PREGNANCY: Preg Test, Ur: NEGATIVE

## 2016-06-23 NOTE — Progress Notes (Signed)
Subjective:     [name] is a 25 y.o. female who presents for a postpartum visit. She is 2 weeks postpartum following a low cervical transverse Cesarean section. I have fully reviewed the prenatal and intrapartum course. The delivery was at 41 gestational weeks. Outcome: primary cesarean section, low transverse incision secondary to arrest of descent. Anesthesia: epidural. Postpartum course has been uncomplicated. Baby's course has been uncomplicated. Baby is feeding by bottle - Similac Advance. Bleeding thin lochia. Bowel function is normal. Bladder function is normal. Patient is sexually active. Contraception method is condoms. Postpartum depression screening: negative.     Review of Systems Pertinent items are noted in HPI.   Objective:    BP 128/85   Pulse 89   Temp 99.2 F (37.3 C) (Oral)   Wt 198 lb (89.8 kg)   BMI 36.21 kg/m         Assessment:     Normal 2-week postpartum exam. Pap smear not done at today's visit.   Plan:.    1. Contraception: Nexplanon Patient advised to use condoms for the next 2 weeks 2. Patient given informed consent, signed copy in the chart, time out was performed. Pregnancy test was negative. Appropriate time out taken.  Patient's left arm was prepped and draped in the usual sterile fashion.. The ruler used to measure and mark insertion area.  Patient was prepped with alcohol swab and then injected with 2 cc of 1% lidocaine with epinephrine.  Patient was prepped with betadine, Nexplanon removed form packaging.  Device confirmed in needle, then inserted full length of needle and withdrawn per handbook instructions.  Patient insertion site covered with a pressure dressing.   Minimal blood loss.  Patient tolerated the procedure well.    3. Follow up in: 4 weeks or as needed.

## 2016-07-10 ENCOUNTER — Ambulatory Visit: Payer: Self-pay | Admitting: Obstetrics and Gynecology

## 2019-09-26 ENCOUNTER — Ambulatory Visit: Payer: Medicaid Other | Admitting: Obstetrics

## 2019-09-30 ENCOUNTER — Emergency Department (HOSPITAL_COMMUNITY)
Admission: EM | Admit: 2019-09-30 | Discharge: 2019-09-30 | Discharge status: Absent without leave | Payer: Medicaid Other

## 2019-10-01 ENCOUNTER — Other Ambulatory Visit: Payer: Self-pay

## 2019-10-01 DIAGNOSIS — Z20822 Contact with and (suspected) exposure to covid-19: Secondary | ICD-10-CM

## 2019-10-03 LAB — NOVEL CORONAVIRUS, NAA: SARS-CoV-2, NAA: NOT DETECTED

## 2019-10-09 ENCOUNTER — Ambulatory Visit: Payer: Medicaid Other | Admitting: Obstetrics

## 2019-10-23 ENCOUNTER — Other Ambulatory Visit: Payer: Self-pay

## 2019-10-23 ENCOUNTER — Ambulatory Visit
Admission: EM | Admit: 2019-10-23 | Discharge: 2019-10-23 | Disposition: A | Discharge status: Home / Self Care | Payer: Medicaid Other | Attending: Physician Assistant | Admitting: Physician Assistant

## 2019-10-23 DIAGNOSIS — J209 Acute bronchitis, unspecified: Secondary | ICD-10-CM

## 2019-10-23 DIAGNOSIS — Z20828 Contact with and (suspected) exposure to other viral communicable diseases: Secondary | ICD-10-CM | POA: Diagnosis not present

## 2019-10-23 DIAGNOSIS — R059 Cough, unspecified: Secondary | ICD-10-CM

## 2019-10-23 DIAGNOSIS — F17210 Nicotine dependence, cigarettes, uncomplicated: Secondary | ICD-10-CM | POA: Diagnosis not present

## 2019-10-23 DIAGNOSIS — R05 Cough: Secondary | ICD-10-CM

## 2019-10-23 MED ORDER — PREDNISONE 50 MG PO TABS: 50.0000 mg | ORAL_TABLET | Freq: Every day | ORAL | 0 refills | Status: DC

## 2019-10-23 MED ORDER — DEXAMETHASONE SODIUM PHOSPHATE 10 MG/ML IJ SOLN
10.0000 mg | Freq: Once | INTRAMUSCULAR | Status: AC
  Administered 2019-10-23: 10 mg via INTRAMUSCULAR

## 2019-10-23 MED ORDER — ALBUTEROL SULFATE HFA 108 (90 BASE) MCG/ACT IN AERS
2.0000 | INHALATION_SPRAY | Freq: Once | RESPIRATORY_TRACT | Status: AC
  Administered 2019-10-23: 4 via RESPIRATORY_TRACT

## 2019-10-23 NOTE — ED Triage Notes (Signed)
Pt c/o cough and congestion x1wk. Pt c/o SOB with talking walking

## 2019-10-23 NOTE — ED Provider Notes (Signed)
[facility]    CSN: [id number] Arrival date & time: 10/23/19  1828      History   Chief Complaint Chief Complaint  Patient presents with  . Cough    HPI [name] is a 28 y.o. female.   28 year old female comes in for 7 day of URI symptoms. Has had cough, rhinorrhea, nasal congestion. Has had dyspnea on exertion. Had a few days of diarrhea that has resolved. Denies fever, chills, body aches. Denies abdominal pain, nausea, vomiting. Denies  loss of taste/smell. Current every day smoker, 1ppd. Has had to decrease due to current symptoms.      Past Medical History:  Diagnosis Date  . ADHD (attention deficit hyperactivity disorder)     Patient Active Problem List   Diagnosis Date Noted  . Post-dates pregnancy 06/01/2016  . Obesity in pregnancy 05/17/2016  . BMI 40.0-44.9, adult (HCC) 05/17/2016  . Tobacco abuse 05/17/2016  . GBS (group B Streptococcus carrier), +RV culture, currently pregnant 05/17/2016  . Marijuana abuse 05/17/2016  . S/P cesarean section for arrest of descent 12/04/2015    Past Surgical History:  Procedure Laterality Date  . CESAREAN SECTION N/A 06/03/2016   Procedure: CESAREAN SECTION;  Surgeon: Tereso Newcomer, MD;  [facility]: [facility];  Service: Obstetrics;  Laterality: N/A;  . MULTIPLE EXTRACTIONS WITH ALVEOLOPLASTY  06/28/2012   Procedure: MULTIPLE EXTRACION WITH ALVEOLOPLASTY;  Surgeon: Georgia Lopes, DDS;  Location: [facility];  Service: Oral Surgery;  Laterality: Bilateral;  extract teeth numbers 2,3,4,5,6,7,8,9,10,11,13,14,19,20,22,23,24,26,27 with alveoplasty all quadrants     OB History    Gravida  1   Para  1   Term  1   Preterm  0   AB  0   Living  1     SAB  0   TAB  0   Ectopic  0   Multiple  0   Live Births  1            Home Medications    Prior to Admission medications   Medication Sig Start Date End Date Taking? Authorizing Provider  predniSONE (DELTASONE) 50 MG tablet Take 1  tablet (50 mg total) by mouth daily with breakfast. 10/23/19   Belinda Fisher, PA-C    Family History History reviewed. No pertinent family history.  Social History Social History   Tobacco Use  . Smoking status: Current Every Day Smoker    Packs/day: 1.00    Years: 7.00    Pack years: 7.00    Types: Cigarettes  . Smokeless tobacco: Never Used  Substance Use Topics  . Alcohol use: No  . Drug use: Yes    Types: Marijuana    Comment:  last used 1.5 weeks ago     Allergies   Patient has no known allergies.   Review of Systems Review of Systems  Reason unable to perform ROS: See HPI as above.     Physical Exam Triage Vital Signs ED Triage Vitals  Enc Vitals Group     BP 10/23/19 1838 (!) 147/92     Pulse Rate 10/23/19 1838 (!) 105     Resp 10/23/19 1838 18     Temp 10/23/19 1838 98.2 F (36.8 C)     Temp Source 10/23/19 1838 Oral     SpO2 10/23/19 1838 96 %     Weight --      Height --      Head Circumference --  Peak Flow --      Pain Score 10/23/19 1839 0     Pain Loc --      Pain Edu? --      Excl. in [place]? --    No data found.  Updated Vital Signs BP (!) 147/92 (BP Location: Left Arm)   Pulse (!) 105   Temp 98.2 F (36.8 C) (Oral)   Resp 18   SpO2 96%   Physical Exam Constitutional:      General: She is not in acute distress.    Appearance: Normal appearance. She is not ill-appearing, toxic-appearing or diaphoretic.  HENT:     Head: Normocephalic and atraumatic.     Mouth/Throat:     Mouth: Mucous membranes are moist.     Pharynx: Oropharynx is clear. Uvula midline.  Cardiovascular:     Rate and Rhythm: Normal rate and regular rhythm.     Heart sounds: Normal heart sounds. No murmur. No friction rub. No gallop.   Pulmonary:     Effort: Pulmonary effort is normal. No accessory muscle usage, prolonged expiration, respiratory distress or retractions.     Comments: Speaking in full sentences, though sometimes interrupted by cough.  Bronchitic  cough throughout visit.  Lungs with wheezing in the periphery, good air movement. Musculoskeletal:     Cervical back: Normal range of motion and neck supple.  Skin:    General: Skin is warm and dry.  Neurological:     General: No focal deficit present.     Mental Status: She is alert and oriented to person, place, and time.    UC Treatments / Results  Labs (all labs ordered are listed, but only abnormal results are displayed) Labs Reviewed  NOVEL CORONAVIRUS, NAA    EKG   Radiology No results found.  Procedures Procedures (including critical care time)  Medications Ordered in UC Medications  albuterol (VENTOLIN HFA) 108 (90 Base) MCG/ACT inhaler 2 puff (4 puffs Inhalation Given 10/23/19 1909)  dexamethasone (DECADRON) injection 10 mg (10 mg Intramuscular Given 10/23/19 1910)    Initial Impression / Assessment and Plan / UC Course  I have reviewed the triage vital signs and the nursing notes.  Pertinent labs & imaging results that were available during my care of the patient were reviewed by me and considered in my medical decision making (see chart for details).    Patient speaking in full sentences, occasionally broken up by cough.  No accessory muscle use, O2 sat stable.  Lungs with wheezing to the periphery.  Albuterol 2 puffs: Patient with improvement of chest tightness and shortness of breath.  Lungs with improvement of wheezing, still with mild wheezing to the right lower lobe, good air movement.  Albuterol 2 puffs x 2: Again with improvement of symptoms.  Lungs clear to auscultation bilaterally without adventitious lung sounds.  Covid testing ordered, patient to remain in quarantine until testing results return.  Decadron injection in office today.  Otherwise start prednisone for bronchitis/reactive airway.  Refrain from smoking.  Albuterol as needed for symptoms.  Return precautions given.  Patient expresses understanding and agrees to plan.  Final Clinical  Impressions(s) / UC Diagnoses   Final diagnoses:  Cough  Acute bronchitis, unspecified organism   ED Prescriptions    Medication Sig Dispense Auth. Provider   predniSONE (DELTASONE) 50 MG tablet Take 1 tablet (50 mg total) by mouth daily with breakfast. 5 tablet Tasia Catchings, Tong Pieczynski V, PA-C     I have reviewed the PDMP during this  encounter.   Belinda FisherYu, Jourdyn Hasler V, PA-C 10/23/19 1912

## 2019-10-23 NOTE — Discharge Instructions (Signed)
COVID PCR testing ordered. I would like you to quarantine until testing results. Decadron injection in office today.  Continue albuterol as needed for shortness of breath/chest tightness every 4-6 hours.  Prednisone as directed.  If experiencing shortness of breath, trouble breathing, go to the emergency department for further evaluation needed.

## 2019-10-26 LAB — NOVEL CORONAVIRUS, NAA: SARS-CoV-2, NAA: NOT DETECTED

## 2020-01-15 ENCOUNTER — Other Ambulatory Visit (HOSPITAL_COMMUNITY)
Admission: RE | Admit: 2020-01-15 | Discharge: 2020-01-15 | Disposition: A | Discharge status: Home / Self Care | Payer: Medicaid Other | Source: Ambulatory Visit | Attending: Obstetrics & Gynecology | Admitting: Obstetrics & Gynecology

## 2020-01-15 ENCOUNTER — Other Ambulatory Visit: Payer: Self-pay

## 2020-01-15 ENCOUNTER — Encounter: Payer: Self-pay | Admitting: Obstetrics & Gynecology

## 2020-01-15 ENCOUNTER — Ambulatory Visit: Payer: Medicaid Other | Admitting: Obstetrics & Gynecology

## 2020-01-15 VITALS — BP 135/80 | HR 90 | Ht 62.0 in | Wt 173.6 lb

## 2020-01-15 DIAGNOSIS — K432 Incisional hernia without obstruction or gangrene: Secondary | ICD-10-CM

## 2020-01-15 DIAGNOSIS — E669 Obesity, unspecified: Secondary | ICD-10-CM

## 2020-01-15 DIAGNOSIS — Z01419 Encounter for gynecological examination (general) (routine) without abnormal findings: Secondary | ICD-10-CM | POA: Diagnosis not present

## 2020-01-15 DIAGNOSIS — Z124 Encounter for screening for malignant neoplasm of cervix: Secondary | ICD-10-CM

## 2020-01-15 DIAGNOSIS — Z6831 Body mass index (BMI) 31.0-31.9, adult: Secondary | ICD-10-CM | POA: Diagnosis not present

## 2020-01-15 NOTE — Patient Instructions (Signed)
Etonogestrel implant What is this medicine? ETONOGESTREL (et oh noe JES trel) is a contraceptive (birth control) device. It is used to prevent pregnancy. It can be used for up to 3 years. This medicine may be used for other purposes; ask your health care provider or pharmacist if you have questions. COMMON BRAND NAME(S): Implanon, Nexplanon What should I tell my health care provider before I take this medicine? They need to know if you have any of these conditions:  abnormal vaginal bleeding  blood vessel disease or blood clots  breast, cervical, endometrial, ovarian, liver, or uterine cancer  diabetes  gallbladder disease  heart disease or recent heart attack  high blood pressure  high cholesterol or triglycerides  kidney disease  liver disease  migraine headaches  seizures  stroke  tobacco smoker  an unusual or allergic reaction to etonogestrel, anesthetics or antiseptics, other medicines, foods, dyes, or preservatives  pregnant or trying to get pregnant  breast-feeding How should I use this medicine? This device is inserted just under the skin on the inner side of your upper arm by a health care professional. Talk to your pediatrician regarding the use of this medicine in children. Special care may be needed. Overdosage: If you think you have taken too much of this medicine contact a poison control center or emergency room at once. NOTE: This medicine is only for you. Do not share this medicine with others. What if I miss a dose? This does not apply. What may interact with this medicine? Do not take this medicine with any of the following medications:  amprenavir  fosamprenavir This medicine may also interact with the following medications:  acitretin  aprepitant  armodafinil  bexarotene  bosentan  carbamazepine  certain medicines for fungal infections like fluconazole, ketoconazole, itraconazole and voriconazole  certain medicines to treat  hepatitis, HIV or AIDS  cyclosporine  felbamate  griseofulvin  lamotrigine  modafinil  oxcarbazepine  phenobarbital  phenytoin  primidone  rifabutin  rifampin  rifapentine  St. John's wort  topiramate This list may not describe all possible interactions. Give your health care provider a list of all the medicines, herbs, non-prescription drugs, or dietary supplements you use. Also tell them if you smoke, drink alcohol, or use illegal drugs. Some items may interact with your medicine. What should I watch for while using this medicine? This product does not protect you against HIV infection (AIDS) or other sexually transmitted diseases. You should be able to feel the implant by pressing your fingertips over the skin where it was inserted. Contact your doctor if you cannot feel the implant, and use a non-hormonal birth control method (such as condoms) until your doctor confirms that the implant is in place. Contact your doctor if you think that the implant may have broken or become bent while in your arm. You will receive a user card from your health care provider after the implant is inserted. The card is a record of the location of the implant in your upper arm and when it should be removed. Keep this card with your health records. What side effects may I notice from receiving this medicine? Side effects that you should report to your doctor or health care professional as soon as possible:  allergic reactions like skin rash, itching or hives, swelling of the face, lips, or tongue  breast lumps, breast tissue changes, or discharge  breathing problems  changes in emotions or moods  coughing up blood  if you feel that the implant   may have broken or bent while in your arm  high blood pressure  pain, irritation, swelling, or bruising at the insertion site  scar at site of insertion  signs of infection at the insertion site such as fever, and skin redness, pain or  discharge  signs and symptoms of a blood clot such as breathing problems; changes in vision; chest pain; severe, sudden headache; pain, swelling, warmth in the leg; trouble speaking; sudden numbness or weakness of the face, arm or leg  signs and symptoms of liver injury like dark yellow or brown urine; general ill feeling or flu-like symptoms; light-colored stools; loss of appetite; nausea; right upper belly pain; unusually weak or tired; yellowing of the eyes or skin  unusual vaginal bleeding, discharge Side effects that usually do not require medical attention (report to your doctor or health care professional if they continue or are bothersome):  acne  breast pain or tenderness  headache  irregular menstrual bleeding  nausea This list may not describe all possible side effects. Call your doctor for medical advice about side effects. You may report side effects to FDA at 1-800-FDA-1088. Where should I keep my medicine? This drug is given in a hospital or clinic and will not be stored at home. NOTE: This sheet is a summary. It may not cover all possible information. If you have questions about this medicine, talk to your doctor, pharmacist, or health care provider.  2020 Elsevier/Gold Standard (2019-07-23 11:33:04)  

## 2020-01-15 NOTE — Progress Notes (Signed)
Patient ID: [name], female   DOB: 11-15-1990, 29 y.o.   MRN: [id number]  Chief Complaint  Patient presents with  . New Patient (Initial Visit)  reestablish care  HPI [facility] [name] is a 29 y.o. female.  G1P1001 Patient's last menstrual period was 01/06/2020 (approximate). Nexplanon since 05/2016. She wants to replace this but she comes today with her 59 y/o daughter and reschedule for when she can have childcare. HPI  Past Medical History:  Diagnosis Date  . ADHD (attention deficit hyperactivity disorder)     Past Surgical History:  Procedure Laterality Date  . CESAREAN SECTION N/A 06/03/2016   Procedure: CESAREAN SECTION;  Surgeon: Tereso Newcomer, MD;  Location: [facility];  Service: Obstetrics;  Laterality: N/A;  . MULTIPLE EXTRACTIONS WITH ALVEOLOPLASTY  06/28/2012   Procedure: MULTIPLE EXTRACION WITH ALVEOLOPLASTY;  Surgeon: Georgia Lopes, DDS;  Location: [facility];  Service: Oral Surgery;  Laterality: Bilateral;  extract teeth numbers 2,3,4,5,6,7,8,9,10,11,13,14,19,20,22,23,24,26,27 with alveoplasty all quadrants     History reviewed. No pertinent family history.  Social History Social History   Tobacco Use  . Smoking status: Current Every Day Smoker    Packs/day: 1.00    Years: 7.00    Pack years: 7.00    Types: Cigarettes  . Smokeless tobacco: Never Used  Substance Use Topics  . Alcohol use: No  . Drug use: Yes    Types: Marijuana    Comment:  last used 1.5 weeks ago    No Known Allergies  Current Outpatient Medications  Medication Sig Dispense Refill  . predniSONE (DELTASONE) 50 MG tablet Take 1 tablet (50 mg total) by mouth daily with breakfast. (Patient not taking: Reported on 01/15/2020) 5 tablet 0   No current facility-administered medications for this visit.    Review of Systems Review of Systems  Respiratory: Negative.   Cardiovascular: Negative.   Gastrointestinal: Positive for abdominal distention (notes bulging at incision site  intemittent).  Genitourinary: Positive for menstrual problem (irregular not heavy). Negative for vaginal bleeding and vaginal discharge.  Psychiatric/Behavioral: Negative.     Blood pressure 135/80, pulse 90, height 5\' 2"  (1.575 m), weight 173 lb 9.6 oz (78.7 kg), last menstrual period 01/06/2020, not currently breastfeeding.  Physical Exam Physical Exam Constitutional:      Appearance: She is obese. She is not ill-appearing.  Cardiovascular:     Rate and Rhythm: Normal rate.  Pulmonary:     Effort: Pulmonary effort is normal.  Abdominal:     Hernia: A hernia (suspect hernia at midline low abdomen incision site) is present.  Genitourinary:    Comments: Pelvic exam: normal external genitalia, vulva, vagina, cervix, uterus and adnexa.  Neurological:     General: No focal deficit present.     Mental Status: She is alert.  Psychiatric:        Mood and Affect: Mood normal.    Breasts: breasts appear normal, no suspicious masses, no skin or nipple changes or axillary nodes.  Data Reviewed Office notes  Assessment Well woman exam with routine gynecological exam - Plan: Cytology - PAP( [place]), Cervicovaginal ancillary only( [place])  Obesity (BMI 30.0-34.9) - Plan: CT ABDOMEN PELVIS W CONTRAST  Incisional hernia, without obstruction or gangrene - Plan: CT ABDOMEN PELVIS W CONTRAST    Plan  F/u CT result and refer if hernia is identified. RTC for Nexplanon replacement   02-25-1992 01/15/2020, 3:17 PM

## 2020-01-15 NOTE — Progress Notes (Signed)
Pt is here to re-establish care. Pt had Nexplanon inserted in 05/2016 and needs removal, pt requests Nexplanon reinsertion. Last pap 12/04/2015, normal.

## 2020-01-16 LAB — CYTOLOGY - PAP: Diagnosis: NEGATIVE

## 2020-01-28 ENCOUNTER — Ambulatory Visit (HOSPITAL_COMMUNITY)
Admission: RE | Admit: 2020-01-28 | Discharge: 2020-01-28 | Disposition: A | Discharge status: Home / Self Care | Payer: Medicaid Other | Source: Ambulatory Visit | Attending: Obstetrics & Gynecology | Admitting: Obstetrics & Gynecology

## 2020-01-28 ENCOUNTER — Other Ambulatory Visit: Payer: Self-pay

## 2020-01-28 DIAGNOSIS — K432 Incisional hernia without obstruction or gangrene: Secondary | ICD-10-CM | POA: Diagnosis present

## 2020-01-28 DIAGNOSIS — E669 Obesity, unspecified: Secondary | ICD-10-CM | POA: Insufficient documentation

## 2020-01-28 MED ORDER — IOHEXOL 300 MG/ML  SOLN
100.0000 mL | Freq: Once | INTRAMUSCULAR | Status: AC | PRN
  Administered 2020-01-28: 100 mL via INTRAVENOUS

## 2020-02-05 ENCOUNTER — Other Ambulatory Visit: Payer: Self-pay

## 2020-02-05 ENCOUNTER — Encounter: Payer: Self-pay | Admitting: Obstetrics & Gynecology

## 2020-02-05 ENCOUNTER — Ambulatory Visit (INDEPENDENT_AMBULATORY_CARE_PROVIDER_SITE_OTHER): Payer: Medicaid Other | Admitting: Obstetrics & Gynecology

## 2020-02-05 VITALS — BP 111/74 | HR 65 | Wt 175.0 lb

## 2020-02-05 DIAGNOSIS — K432 Incisional hernia without obstruction or gangrene: Secondary | ICD-10-CM | POA: Diagnosis not present

## 2020-02-05 DIAGNOSIS — E669 Obesity, unspecified: Secondary | ICD-10-CM | POA: Diagnosis not present

## 2020-02-05 DIAGNOSIS — Z3046 Encounter for surveillance of implantable subdermal contraceptive: Secondary | ICD-10-CM | POA: Diagnosis not present

## 2020-02-05 MED ORDER — ETONOGESTREL 68 MG ~~LOC~~ IMPL
68.0000 mg | DRUG_IMPLANT | Freq: Once | SUBCUTANEOUS | Status: AC
  Administered 2020-02-05: 68 mg via SUBCUTANEOUS

## 2020-02-05 NOTE — Progress Notes (Signed)
GYNECOLOGY OFFICE PROCEDURE NOTE  [name] is a 29 y.o. G1P1001 here for Nexplanon removal and Nexplanon insertion.  Last pap smear was on 01/15/20 and was normal.  No other gynecologic concerns.  Nexplanon Removal and Insertion  Patient identified, informed consent performed, consent signed.   Patient does understand that irregular bleeding is a very common side effect of this medication. She was advised to have backup contraception for one week after replacement of the implant. Pregnancy test in clinic today was negative.  Appropriate time out taken. Implanon site identified. Area prepped in usual sterile fashon. One ml of 1% lidocaine was used to anesthetize the area at the distal end of the implant. A small stab incision was made right beside the implant on the distal portion. The Nexplanon rod was grasped using hemostats and removed without difficulty. There was minimal blood loss. There were no complications. Area was then injected with 3 ml of 1 % lidocaine. She was re-prepped with betadine, Nexplanon removed from packaging, Device confirmed in needle, then inserted full length of needle and withdrawn per handbook instructions. Nexplanon was able to palpated in the patient's arm; patient palpated the insert herself.  There was minimal blood loss. Patient insertion site covered with gauze and a pressure bandage to reduce any bruising. The patient tolerated the procedure well and was given post procedure instructions.  She was advised to have backup contraception for one week.    Adam Phenix, MD Attending Obstetrician & Gynecologist, [facility]  02/05/2020

## 2020-02-05 NOTE — Progress Notes (Signed)
RGYN patient presents for Nexplanon Removal and Re-Insertion. Pt denies any unprotected intercourse x 14 days.   Per provider no UPT needed.    Insertion Date : 06/23/2016 Left Arm  Pt notes needs to discuss results of CT scan done on 01/28/2020.

## 2020-02-05 NOTE — Patient Instructions (Signed)
Etonogestrel implant What is this medicine? ETONOGESTREL (et oh noe JES trel) is a contraceptive (birth control) device. It is used to prevent pregnancy. It can be used for up to 3 years. This medicine may be used for other purposes; ask your health care provider or pharmacist if you have questions. COMMON BRAND NAME(S): Implanon, Nexplanon What should I tell my health care provider before I take this medicine? They need to know if you have any of these conditions:  abnormal vaginal bleeding  blood vessel disease or blood clots  breast, cervical, endometrial, ovarian, liver, or uterine cancer  diabetes  gallbladder disease  heart disease or recent heart attack  high blood pressure  high cholesterol or triglycerides  kidney disease  liver disease  migraine headaches  seizures  stroke  tobacco smoker  an unusual or allergic reaction to etonogestrel, anesthetics or antiseptics, other medicines, foods, dyes, or preservatives  pregnant or trying to get pregnant  breast-feeding How should I use this medicine? This device is inserted just under the skin on the inner side of your upper arm by a health care professional. Talk to your pediatrician regarding the use of this medicine in children. Special care may be needed. Overdosage: If you think you have taken too much of this medicine contact a poison control center or emergency room at once. NOTE: This medicine is only for you. Do not share this medicine with others. What if I miss a dose? This does not apply. What may interact with this medicine? Do not take this medicine with any of the following medications:  amprenavir  fosamprenavir This medicine may also interact with the following medications:  acitretin  aprepitant  armodafinil  bexarotene  bosentan  carbamazepine  certain medicines for fungal infections like fluconazole, ketoconazole, itraconazole and voriconazole  certain medicines to treat  hepatitis, HIV or AIDS  cyclosporine  felbamate  griseofulvin  lamotrigine  modafinil  oxcarbazepine  phenobarbital  phenytoin  primidone  rifabutin  rifampin  rifapentine  St. John's wort  topiramate This list may not describe all possible interactions. Give your health care provider a list of all the medicines, herbs, non-prescription drugs, or dietary supplements you use. Also tell them if you smoke, drink alcohol, or use illegal drugs. Some items may interact with your medicine. What should I watch for while using this medicine? This product does not protect you against HIV infection (AIDS) or other sexually transmitted diseases. You should be able to feel the implant by pressing your fingertips over the skin where it was inserted. Contact your doctor if you cannot feel the implant, and use a non-hormonal birth control method (such as condoms) until your doctor confirms that the implant is in place. Contact your doctor if you think that the implant may have broken or become bent while in your arm. You will receive a user card from your health care provider after the implant is inserted. The card is a record of the location of the implant in your upper arm and when it should be removed. Keep this card with your health records. What side effects may I notice from receiving this medicine? Side effects that you should report to your doctor or health care professional as soon as possible:  allergic reactions like skin rash, itching or hives, swelling of the face, lips, or tongue  breast lumps, breast tissue changes, or discharge  breathing problems  changes in emotions or moods  coughing up blood  if you feel that the implant   may have broken or bent while in your arm  high blood pressure  pain, irritation, swelling, or bruising at the insertion site  scar at site of insertion  signs of infection at the insertion site such as fever, and skin redness, pain or  discharge  signs and symptoms of a blood clot such as breathing problems; changes in vision; chest pain; severe, sudden headache; pain, swelling, warmth in the leg; trouble speaking; sudden numbness or weakness of the face, arm or leg  signs and symptoms of liver injury like dark yellow or brown urine; general ill feeling or flu-like symptoms; light-colored stools; loss of appetite; nausea; right upper belly pain; unusually weak or tired; yellowing of the eyes or skin  unusual vaginal bleeding, discharge Side effects that usually do not require medical attention (report to your doctor or health care professional if they continue or are bothersome):  acne  breast pain or tenderness  headache  irregular menstrual bleeding  nausea This list may not describe all possible side effects. Call your doctor for medical advice about side effects. You may report side effects to FDA at 1-800-FDA-1088. Where should I keep my medicine? This drug is given in a hospital or clinic and will not be stored at home. NOTE: This sheet is a summary. It may not cover all possible information. If you have questions about this medicine, talk to your doctor, pharmacist, or health care provider.  2020 Elsevier/Gold Standard (2019-07-23 11:33:04)  

## 2020-02-05 NOTE — Progress Notes (Signed)
Patient ID: [name], female   DOB: May 24, 1991, 29 y.o.   MRN: [id number]  Chief Complaint  Patient presents with  . Contraception  Review CT result  HPI [name] is a 29 y.o. female.  G1P1001 She had nexplanon replacement today, and wishes to review her CT that was done to diagnose possible incisional hernia. The result was negative HPI  Past Medical History:  Diagnosis Date  . ADHD (attention deficit hyperactivity disorder)     Past Surgical History:  Procedure Laterality Date  . CESAREAN SECTION N/A 06/03/2016   Procedure: CESAREAN SECTION;  Surgeon: Tereso Newcomer, MD;  Location: [facility];  Service: Obstetrics;  Laterality: N/A;  . MULTIPLE EXTRACTIONS WITH ALVEOLOPLASTY  06/28/2012   Procedure: MULTIPLE EXTRACION WITH ALVEOLOPLASTY;  Surgeon: Georgia Lopes, DDS;  Location: [facility];  Service: Oral Surgery;  Laterality: Bilateral;  extract teeth numbers 2,3,4,5,6,7,8,9,10,11,13,14,19,20,22,23,24,26,27 with alveoplasty all quadrants     History reviewed. No pertinent family history.  Social History Social History   Tobacco Use  . Smoking status: Current Every Day Smoker    Packs/day: 1.00    Years: 7.00    Pack years: 7.00    Types: Cigarettes  . Smokeless tobacco: Never Used  Substance Use Topics  . Alcohol use: No  . Drug use: Yes    Types: Marijuana    Comment:  last used 1.5 weeks ago    No Known Allergies  Current Outpatient Medications  Medication Sig Dispense Refill  . etonogestrel (NEXPLANON) 68 MG IMPL implant 1 each by Subdermal route once.    . predniSONE (DELTASONE) 50 MG tablet Take 1 tablet (50 mg total) by mouth daily with breakfast. (Patient not taking: Reported on 01/15/2020) 5 tablet 0   Current Facility-Administered Medications  Medication Dose Route Frequency Provider Last Rate Last Admin  . etonogestrel (NEXPLANON) implant 68 mg  68 mg Subdermal Once Adam Phenix, MD        Review of Systems Review of Systems   Gastrointestinal:       Intermittent mass midline lower abdominal incision site    Blood pressure 111/74, pulse 65, weight 175 lb (79.4 kg), last menstrual period 01/06/2020.  Physical Exam Physical Exam Constitutional:      Appearance: Normal appearance. She is obese.  Pulmonary:     Effort: Pulmonary effort is normal.  Abdominal:     Palpations: There is mass (reducible and not tender, mid low abdomen, palpable when standing).  Neurological:     Mental Status: She is alert.     Data Reviewed CLINICAL DATA:  Ventral incisional hernia.  EXAM: CT ABDOMEN AND PELVIS WITH CONTRAST  TECHNIQUE: Multidetector CT imaging of the abdomen and pelvis was performed using the standard protocol following bolus administration of intravenous contrast.  CONTRAST:  OMNIPAQUE IOHEXOL 300 MG/ML  SOLN  COMPARISON:  Noncontrast abdominopelvic CT 08/04/2012.  FINDINGS: Lower chest: Clear lung bases. No significant pleural or pericardial effusion.  Hepatobiliary: Subjective mild hepatic steatosis as before. No focal hepatic abnormality. The gallbladder is mildly distended without wall thickening or surrounding inflammation. No evidence of gallstones or biliary dilatation.  Pancreas: Unremarkable. No pancreatic ductal dilatation or surrounding inflammatory changes.  Spleen: Normal in size without focal abnormality.  Adrenals/Urinary Tract: Both adrenal glands appear normal. The kidneys and ureters appear normal. No evidence of urinary tract calculus or hydronephrosis. The bladder is nearly empty without apparent abnormality.  Stomach/Bowel: No evidence of bowel wall thickening, distention or surrounding inflammatory  change. The appendix appears normal. Moderate stool throughout the colon.  Vascular/Lymphatic: There are no enlarged abdominal or pelvic lymph nodes. No significant vascular findings.  Reproductive: The uterus and ovaries appear normal. No adnexal  mass.  Other: No evidence of abdominal wall mass or hernia. No ascites.  Musculoskeletal: No acute or significant osseous findings.  IMPRESSION: 1. No acute findings or explanation for the patient's symptoms. 2. No evidence of abdominal wall mass or hernia. 3. Subjective mild hepatic steatosis.   Electronically Signed   By: Richardean Sale M.D.   On: 01/31/2020 18:03 I reviewed CT images Assessment Sx suspicious for incisional hernia but CT was not diagnostic  Plan Referral to general surgery for examination and diagnosis for possible hernia    Emeterio Reeve 02/05/2020, 3:48 PM

## 2020-02-25 ENCOUNTER — Ambulatory Visit: Payer: Self-pay | Admitting: Surgery

## 2020-02-27 ENCOUNTER — Ambulatory Visit: Payer: Self-pay | Admitting: Surgery

## 2020-02-27 ENCOUNTER — Encounter: Payer: Self-pay | Admitting: Surgery

## 2020-02-27 ENCOUNTER — Ambulatory Visit (INDEPENDENT_AMBULATORY_CARE_PROVIDER_SITE_OTHER): Payer: Medicaid Other | Admitting: Surgery

## 2020-02-27 ENCOUNTER — Other Ambulatory Visit: Payer: Self-pay

## 2020-02-27 VITALS — BP 141/95 | HR 80 | Temp 98.6°F | Resp 14 | Ht 63.0 in | Wt 187.0 lb

## 2020-02-27 DIAGNOSIS — K432 Incisional hernia without obstruction or gangrene: Secondary | ICD-10-CM | POA: Diagnosis not present

## 2020-02-27 NOTE — Patient Instructions (Signed)
You may try using a Fiber supplement every day. You may also try Miralax 2-3 times daily in a full glass of water. Adjust this as needed. You should be having a bowel movement 1-2 times a day.   May also try eating a high fiber diet. May incorporate prunes or prune juice in your diet. Increase your water intake so that your urine is almost clear.   You have requested to have a Ventral Hernia Repair. This will be done by Dr Christian Mate at [facility]. Please see your (BLUE) Pre-care sheet for more information.  Our surgery scheduler will be in contact with you to look at surgery dates and go over surgery instructions.   You will need to arrange to be out of work for approximately 1-2 weeks and then you may return with a lifting restriction for 4 more weeks. If you have FMLA or Disability paperwork that needs to be filled out, please have your company fax your paperwork to [phone] or you may drop this by either office. This paperwork will be filled out within 3 days after your surgery has been completed.  Ventral Hernia A ventral hernia (also called an incisional hernia) is a hernia that occurs at the site of a previous surgical cut (incision) in the abdomen. The abdominal wall spans from your lower chest down to your pelvis. If the abdominal wall is weakened from a surgical incision, a hernia can occur. A hernia is a bulge of bowel or muscle tissue pushing out on the weakened part of the abdominal wall. Ventral hernias can get bigger from straining or lifting. Obese and older people are at higher risk for a ventral hernia. People who develop infections after surgery or require repeat incisions at the same site on the abdomen are also at increased risk. CAUSES  A ventral hernia occurs because of weakness in the abdominal wall at an incision site.  SYMPTOMS  Common symptoms include:  A visible bulge or lump on the abdominal wall.  Pain or tenderness around the lump.  Increased discomfort if you cough  or make a sudden movement. If the hernia has blocked part of the intestine, a serious complication can occur (incarcerated or strangulated hernia). This can become a problem that requires emergency surgery because the blood flow to the blocked intestine may be cut off. Symptoms may include:  Feeling sick to your stomach (nauseous).  Throwing up (vomiting).  Stomach swelling (distention) or bloating.  Fever.  Rapid heartbeat. DIAGNOSIS  Your health care provider will take a medical history and perform a physical exam. Various tests may be ordered, such as:  Blood tests.  Urine tests.  Ultrasonography.  X-rays.  Computed tomography (CT). TREATMENT  Watchful waiting may be all that is needed for a smaller hernia that does not cause symptoms. Your health care provider may recommend the use of a supportive belt (truss) that helps to keep the abdominal wall intact. For larger hernias or those that cause pain, surgery to repair the hernia is usually recommended. If a hernia becomes strangulated, emergency surgery needs to be done right away. HOME CARE INSTRUCTIONS  Avoid putting pressure or strain on the abdominal area.  Avoid heavy lifting.  Use good body positioning for physical tasks. Ask your health care provider about proper body positioning.  Use a supportive belt as directed by your health care provider.  Maintain a healthy weight.  Eat foods that are high in fiber, such as whole grains, fruits, and vegetables. Fiber helps prevent difficult  bowel movements (constipation).  Drink enough fluids to keep your urine clear or pale yellow.  Follow up with your health care provider as directed. SEEK MEDICAL CARE IF:   Your hernia seems to be getting larger or more painful. SEEK IMMEDIATE MEDICAL CARE IF:   You have abdominal pain that is sudden and sharp.  Your pain becomes severe.  You have repeated vomiting.  You are sweating a lot.  You notice a rapid  heartbeat.  You develop a fever. MAKE SURE YOU:   Understand these instructions.  Will watch your condition.  Will get help right away if you are not doing well or get worse.   This information is not intended to replace advice given to you by your health care provider. Make sure you discuss any questions you have with your health care provider.   Document Released: 09/26/2012 Document Revised: 10/31/2014 Document Reviewed: 09/26/2012 Elsevier Interactive Patient Education 2016 [employer] Inc[employer]    Laparoscopic Ventral Hernia Repair Laparoscopic ventral hernia repairis a surgery to fix a ventral hernia. Aventral hernia, also called an incisional hernia, is a bulge of body tissue or intestines that pushes through the front part of the abdomen. This can happen if the connective tissue covering the muscles over the abdomen has a weak spot or is torn because of a surgical cut (incision) from a previous surgery. Laparoscopic ventral hernia repair is often done soon after diagnosis to stop the hernia from getting bigger, becoming uncomfortable, or becoming an emergency. This surgery usually takes about 2 hours, but the time can vary greatly. LET [facility] CARE PROVIDER KNOW ABOUT:  Any allergies you have.  All medicines you are taking, including steroids, vitamins, herbs, eye drops, creams, and over-the-counter medicines.  Previous problems you or members of your family have had with the use of anesthetics.  Any blood disorders you have.  Previous surgeries you have had.  Medical conditions you have. RISKS AND COMPLICATIONS  Generally, laparoscopic ventral hernia repair is a safe procedure. However, as with any surgical procedure, problems can occur. Possible problems include:  Bleeding.  Trouble passing urine or having a bowel movement after the surgery.  Infection.  Pneumonia.  Blood clots.  Pain in the area of the hernia.  A bulge in the area of the hernia that may be  caused by a collection of fluid.  Injury to intestines or other structures in the abdomen.  Return of the hernia after surgery. In some cases, your health care provider may need to stop the laparoscopic procedure and do regular, open surgery. This may be necessary for very difficult hernias, when organs are hard to see, or when bleeding problems occur during surgery. BEFORE THE PROCEDURE   You may need to have blood tests, urine tests, a chest X-ray, or an electrocardiogram done before the day of the surgery.  Ask your health care provider about changing or stopping your regular medicines. This is especially important if you are taking diabetes medicines or blood thinners.  You may need to wash with a special type of germ-killing soap.  Do not eat or drink anything after midnight the night before the procedure or as directed by your health care provider.  Make plans to have someone drive you home after the procedure. PROCEDURE   Small monitors will be put on your body. They are used to check your heart, blood pressure, and oxygen level.  An IV access tube will be put into a vein in your hand or arm.  Fluids and medicine will flow directly into your body through the IV tube.  You will be given medicine that makes you go to sleep (general anesthetic).  Your abdomen will be cleaned with a special soap to kill any germs on your skin.  Once you are asleep, several small incisions will be made in your abdomen.  The large space in your abdomen will be filled with air so that it expands. This gives your health care provider more room and a better view.  A thin, lighted tube with a tiny camera on the end (laparoscope) is put through a small incision in your abdomen. The camera on the laparoscope sends a picture to a TV screen in the operating room. This gives your health care provider a good view inside your abdomen.  Hollow tubes are put through the other small incisions in your abdomen. The  tools needed for the procedure are put through these tubes.  Your health care provider puts the tissue or intestines that formed the hernia back in place.  A screen-like patch (mesh) is used to close the hernia. This helps make the area stronger. Stitches, tacks, or staples are used to keep the mesh in place.  Medicine and a bandage (dressing) or skin glue will be put over the incisions. AFTER THE PROCEDURE   You will stay in a recovery area until the anesthetic wears off. Your blood pressure and pulse will be checked often.  You may be able to go home the same day or may need to stay in the hospital for 1-2 days after surgery. Your health care provider will decide when you can go home.  You may feel some pain. You may be given medicine for pain.  You will be urged to do breathing exercises that involve taking deep breaths. This helps prevent a lung infection after a surgery.  You may have to wear compression stockings while you are in the hospital. These stockings help keep blood clots from forming in your legs.   This information is not intended to replace advice given to you by your health care provider. Make sure you discuss any questions you have with your health care provider.   Document Released: 09/26/2012 Document Revised: 10/15/2013 Document Reviewed: 09/26/2012 Elsevier Interactive Patient Education [employer]

## 2020-02-27 NOTE — Progress Notes (Signed)
Patient ID: [name], female   DOB: [birth year], 29 y.o.   MRN: [id number]  Chief Complaint: Mobile mass of prior incision.  History of Present Illness [name] is a 29 y.o. female with a history of a sensation on the right side of a Pfannenstiel incision, made for a C-section approximately 4 years ago.  She denies any changes in bowel habits, however reports a significant degree of constipation.  She reports changes in position and the sensation of a mass within the scar and a squishy release and change in position/location.  She denies fevers and chills.  She denies nausea and vomiting.  She has no urinary frequency or hesitancy.  Past Medical History Past Medical History:  Diagnosis Date  . ADHD (attention deficit hyperactivity disorder)       Past Surgical History:  Procedure Laterality Date  . CESAREAN SECTION N/A 06/03/2016   Procedure: CESAREAN SECTION;  Surgeon: Osborne Oman, MD;  Location: [place];  Service: Obstetrics;  Laterality: N/A;  . MULTIPLE EXTRACTIONS WITH ALVEOLOPLASTY  06/28/2012   Procedure: MULTIPLE EXTRACION WITH ALVEOLOPLASTY;  Surgeon: Gae Bon, DDS;  Location: [place];  Service: Oral Surgery;  Laterality: Bilateral;  extract teeth numbers 2,3,4,5,6,7,8,9,10,11,13,14,19,20,22,23,24,26,27 with alveoplasty all quadrants     No Known Allergies  Current Outpatient Medications  Medication Sig Dispense Refill  . etonogestrel (NEXPLANON) 68 MG IMPL implant 1 each by Subdermal route once.     No current facility-administered medications for this visit.    Family History Family History  Problem Relation Age of Onset  . Lung cancer Paternal Grandfather       Social History Social History   Tobacco Use  . Smoking status: Current Every Day Smoker    Packs/day: 1.00    Years: 13.00    Pack years: 13.00    Types: Cigarettes  . Smokeless tobacco: Never Used  Substance Use Topics  . Alcohol use: No  . Drug use: Yes    Types:  Marijuana    Comment:  last used 1.5 weeks ago        Review of Systems  Constitutional: Negative.   HENT: Negative.   Eyes: Negative.   Respiratory: Negative.   Cardiovascular: Negative.   Gastrointestinal: Positive for abdominal pain and constipation.  Genitourinary: Negative.   Musculoskeletal: Negative.   Skin: Negative.   Neurological: Negative.   Endo/Heme/Allergies: Negative.       Physical Exam Blood pressure (!) 141/95, pulse 80, temperature 98.6 F (37 C), resp. rate 14, height 5\' 3"  (1.6 m), weight 187 lb (84.8 kg), SpO2 98 %. Last Weight  Most recent update: 02/27/2020  2:29 PM   Weight  84.8 kg (187 lb)            CONSTITUTIONAL: Well developed, and nourished, appropriately responsive and aware without distress.   EYES: Sclera non-icteric.   EARS, NOSE, MOUTH AND THROAT: Mask worn.    Hearing is intact to voice.  NECK: Trachea is midline, and there is no jugular venous distension.  LYMPH NODES:  Lymph nodes in the neck are not enlarged. RESPIRATORY:  Lungs are clear, and breath sounds are equal bilaterally. Normal respiratory effort without pathologic use of accessory muscles. CARDIOVASCULAR: Heart is regular in rate and rhythm. GI: The abdomen is well rounded, soft, nontender, and nondistended. There is a small palpable mass, which changes and I can appreciate a small 1 cm midline defect. There were normal bowel sounds.  MUSCULOSKELETAL:  Symmetrical muscle tone  appreciated in all four extremities.    SKIN: Skin turgor is normal. No pathologic skin lesions appreciated.  NEUROLOGIC:  Motor and sensation appear grossly normal.  Cranial nerves are grossly without defect. PSYCH:  Alert and oriented to person, place and time. Affect is appropriate for situation.  Data Reviewed I have personally reviewed what is currently available of the patient's imaging, recent labs and medical records.   Labs:  CBC Latest Ref Rng & Units 06/04/2016 06/01/2016 02/25/2016  WBC 4.0  - 10.5 K/uL 13.9(H) 17.6(H) 17.8(H)  Hemoglobin 12.0 - 15.0 g/dL 9.0(L) 11.0(L) 12.8  Hematocrit 36.0 - 46.0 % 27.3(L) 32.4(L) 37.7  Platelets 150 - 400 K/uL 271 340 306   CMP Latest Ref Rng & Units 10/13/2014 06/22/2009 01/25/2008  Glucose 70 - 99 mg/dL 354(S) 97 93  BUN 6 - 23 mg/dL 5(L) 8 7  Creatinine 5.68 - 1.10 mg/dL 1.27 5.17 0.9  Sodium 001 - 147 mEq/L 138 139 138  Potassium 3.7 - 5.3 mEq/L 3.3(L) 3.6 4.3  Chloride 96 - 112 mEq/L 101 107 104  CO2 19 - 32 mEq/L 20 28 -  Calcium 8.4 - 10.5 mg/dL 8.8 9.1 -  Total Protein 6.0 - 8.3 g/dL 6.7 - 5.6(L)  Total Bilirubin 0.3 - 1.2 mg/dL 0.6 - 0.7  Alkaline Phos 39 - 117 U/L 101 - 80  AST 0 - 37 U/L 15 - 19  ALT 0 - 35 U/L 19 - 12      Imaging: Radiology review:  Within last 24 hrs: No results found.  Assessment    Very small incisional hernia, however symptomatic. Patient Active Problem List   Diagnosis Date Noted  . Obesity (BMI 30.0-34.9) 05/17/2016  . Tobacco abuse 05/17/2016  . Marijuana abuse 05/17/2016    Plan    Incisional hernia repair.   Risks of the above procedure include but are not limited to anesthesia, bleeding, infection, hernia recurrence, persistence of pain/symptoms.  Questions answered.  No guarantees ever expressed or implied.  We discussed the role of mesh and hernia repair and felt it would be not indicated in this situation.  Face-to-face time spent with the patient and accompanying care providers(if present) was 30 minutes, with more than 50% of the time spent counseling, educating, and coordinating care of the patient.      Campbell Lerner M.D., FACS 02/27/2020, 3:02 PM

## 2020-03-02 ENCOUNTER — Telehealth: Payer: Self-pay | Admitting: Surgery

## 2020-03-02 NOTE — Telephone Encounter (Signed)
Pt has been advised of Pre-Admission date/time, COVID Testing date and Surgery date.  Surgery Date: 03/27/20 Preadmission Testing Date: 03/19/20 (phone 8a-1p) Covid Testing Date: 03/25/20 - patient [facility] to [facility] ([address] [facility]) between 8a-1p  Patient has been made aware to call [phone], between 1-3:00pm the day before surgery, to find out what time to arrive for surgery.

## 2020-03-19 ENCOUNTER — Other Ambulatory Visit: Payer: Self-pay

## 2020-03-19 ENCOUNTER — Encounter
Admission: RE | Admit: 2020-03-19 | Discharge: 2020-03-19 | Disposition: A | Discharge status: Home / Self Care | Payer: Medicaid Other | Source: Ambulatory Visit | Attending: Surgery | Admitting: Surgery

## 2020-03-19 DIAGNOSIS — Z01812 Encounter for preprocedural laboratory examination: Secondary | ICD-10-CM | POA: Diagnosis not present

## 2020-03-19 HISTORY — DX: Personal history of urinary calculi: Z87.442

## 2020-03-19 HISTORY — DX: Gastro-esophageal reflux disease without esophagitis: K21.9

## 2020-03-19 HISTORY — DX: Anemia, unspecified: D64.9

## 2020-03-19 NOTE — Patient Instructions (Addendum)
Your procedure is scheduled on: 03-27-20 FRIDAY Report to Same Day Surgery 2nd floor medical mall [facility] Entrance-take elevator on left to 2nd floor.  Check in with surgery information desk.) To find out your arrival time please call [phone] between 1PM - 3PM on 03-26-20 THURSDAY  Remember: Instructions that are not followed completely may result in serious medical risk, up to and including death, or upon the discretion of your surgeon and anesthesiologist your surgery may need to be rescheduled.    _x___ 1. Do not eat food after midnight the night before your procedure. NO GUM OR CANDY AFTER MIDNIGHT. You may drink clear liquids up to 2 hours before you are scheduled to arrive at the hospital for your procedure.  Do not drink clear liquids within 2 hours of your scheduled arrival to the hospital.  Clear liquids include  --Water or Apple juice without pulp  --Gatorade  --Black Coffee or Clear Tea (No milk, no creamers, do not add anything to the coffee or Tea-ok to add sugar)   ____Ensure clear carbohydrate drink on the way to the hospital for bariatric patients  ____Ensure clear carbohydrate drink 3 hours before surgery.    __x__ 2. No Alcohol for 24 hours before or after surgery.   __x__3. No Smoking or e-cigarettes for 24 prior to surgery.  Do not use any chewable tobacco products for at least 6 hour prior to surgery   ____  4. Bring all medications with you on the day of surgery if instructed.    __x__ 5. Notify your doctor if there is any change in your medical condition     (cold, fever, infections).    x___6. On the morning of surgery brush your teeth with toothpaste and water.  You may rinse your mouth with mouth wash if you wish.  Do not swallow any toothpaste or mouthwash.   Do not wear jewelry, make-up, hairpins, clips or nail polish.  Do not wear lotions, powders, or perfumes.  Do not shave 48 hours prior to surgery. Men may shave face and neck.  Do not bring  valuables to the hospital.    [facility] is not responsible for any belongings or valuables.               Contacts, dentures or bridgework may not be worn into surgery.  Leave your suitcase in the car. After surgery it may be brought to your room.  For patients admitted to the hospital, discharge time is determined by your treatment team.  _  Patients discharged the day of surgery will not be allowed to drive home.  You will need someone to drive you home and stay with you the night of your procedure.    Please read over the following fact sheets that you were given:   [facility] - Eat Preparing for Surgery and or MRSA Information   ____ TAKE THE FOLLOWING MEDICATION THE MORNING OF SURGERY WITH A SMALL SIP OF WATER. These include:  1. NONE  2.  3.  4.  5.  6.  ____Fleets enema or Magnesium Citrate as directed.   _x___ Use CHG Soap or sage wipes as directed on instruction sheet   ____ Use inhalers on the day of surgery and bring to hospital day of surgery  ____ Stop Metformin and Janumet 2 days prior to surgery.    ____ Take 1/2 of usual insulin dose the night before surgery and none on the morning surgery.   ____ Follow recommendations from  Cardiologist, Pulmonologist or PCP regarding stopping Aspirin, Coumadin, Plavix ,Eliquis, Effient, or Pradaxa, and Pletal.  X____Stop Anti-inflammatories such as Advil, Aleve, Ibuprofen, Motrin, Naproxen, Naprosyn, Goodies powders or aspirin products NOW--OK to take Tylenol   _X___ Stop supplements until after surgery-STOP MELATONIN NOW-YOU MAY RESUME AFTER YOUR SURGERY   ____ Bring C-Pap to the hospital.

## 2020-03-25 ENCOUNTER — Telehealth: Payer: Self-pay | Admitting: *Deleted

## 2020-03-25 ENCOUNTER — Other Ambulatory Visit: Admission: RE | Admit: 2020-03-25 | Payer: Medicaid Other | Source: Ambulatory Visit

## 2020-03-25 NOTE — Telephone Encounter (Signed)
Patient called and stated that she has surgery scheduled for Friday and she woke up last night sick, vomiting, fever, nosy stuffy. She wants to know if she needs to reschedule her surgery.

## 2020-03-25 NOTE — Telephone Encounter (Signed)
Patient states that she has been feeling feverish and has a soar throat today, no further vomiting. Patient advised that we will have to reschedule her surgery. Our surgery scheduler will be in contact with her to look at new dates.

## 2020-03-26 ENCOUNTER — Telehealth: Payer: Self-pay | Admitting: Surgery

## 2020-03-26 NOTE — Telephone Encounter (Signed)
Updated surgery information, outgoing call made, left message for patient to call.  Please advise patient of  Pre-Admission date/time, COVID Testing date and Surgery date.  Surgery Date: 04/29/20 Preadmission Testing Date: 04/23/20 (phone 8a-1p) Covid Testing Date: 04/28/20 - patient [facility] to [facility] ([address] [facility]) between 8a-1p  Also patient needs to call [phone], between 1-3:00pm the day before surgery, to find out what time to arrive for surgery.

## 2020-03-27 ENCOUNTER — Encounter: Admission: RE | Payer: Self-pay | Source: Home / Self Care

## 2020-03-27 ENCOUNTER — Ambulatory Visit: Admission: RE | Admit: 2020-03-27 | Payer: Medicaid Other | Source: Home / Self Care | Admitting: Surgery

## 2020-03-27 SURGERY — REPAIR, HERNIA, INCISIONAL
Anesthesia: General

## 2020-04-16 ENCOUNTER — Telehealth: Payer: Medicaid Other | Admitting: Surgery

## 2020-04-16 ENCOUNTER — Other Ambulatory Visit: Payer: Self-pay

## 2020-04-23 ENCOUNTER — Other Ambulatory Visit: Admission: RE | Admit: 2020-04-23 | Payer: Medicaid Other | Source: Ambulatory Visit

## 2020-04-23 ENCOUNTER — Encounter: Payer: Self-pay | Admitting: Surgery

## 2020-04-28 ENCOUNTER — Telehealth: Payer: Self-pay | Admitting: Surgery

## 2020-04-28 ENCOUNTER — Other Ambulatory Visit
Admission: RE | Admit: 2020-04-28 | Discharge: 2020-04-28 | Disposition: A | Discharge status: Home / Self Care | Payer: Medicaid Other | Source: Ambulatory Visit | Attending: Surgery | Admitting: Surgery

## 2020-04-28 ENCOUNTER — Inpatient Hospital Stay
Admission: RE | Admit: 2020-04-28 | Discharge: 2020-04-28 | Disposition: A | Discharge status: Home / Self Care | Payer: Medicaid Other | Source: Ambulatory Visit

## 2020-04-28 ENCOUNTER — Ambulatory Visit: Payer: Self-pay | Admitting: Surgery

## 2020-04-28 DIAGNOSIS — K432 Incisional hernia without obstruction or gangrene: Secondary | ICD-10-CM

## 2020-04-28 NOTE — Pre-Procedure Instructions (Signed)
Attempted to contact patient via phone to remind her of her covid test needed today for surgery tomorrow.  It went to voicemail so I left a message. Spoke with Britta Mccreedy at Dr. Lajuan Lines office and made her aware of the situation.

## 2020-04-28 NOTE — Pre-Procedure Instructions (Signed)
Patient no show for her Covid screening today. Unable to reach by telephone to do her pre op phone interview.Surgeons office notified

## 2020-04-28 NOTE — Telephone Encounter (Signed)
Outbound call made to pt regarding "no-showing" her scheduled pre-admission phone interview & COVID testing this morning.    The pt is aware that surgery must be rescheduled & she will be notified once this has been completed.  As well, she will need to schedule a follow up appt w/Dr. Claudine Mouton prior to that new surgery scheduling date.  Spoke w/Jamie, RNFA in the OR to notify of the changes & she has dropped the case back in the depot for rescheduling.

## 2020-04-29 ENCOUNTER — Ambulatory Visit: Admission: RE | Admit: 2020-04-29 | Payer: Medicaid Other | Source: Home / Self Care | Admitting: Surgery

## 2020-04-29 ENCOUNTER — Encounter: Admission: RE | Payer: Self-pay | Source: Home / Self Care

## 2020-04-29 SURGERY — REPAIR, HERNIA, INCISIONAL
Anesthesia: General

## 2020-05-04 ENCOUNTER — Telehealth: Payer: Self-pay | Admitting: Surgery

## 2020-05-04 NOTE — Telephone Encounter (Signed)
Updated surgery information.  This is the third time patient has rescheduled surgery.  Outgoing call is made, left message for patient to call.  Please advise patient of Pre-Admission date/time, COVID Testing date and Surgery date.  Surgery Date: 06/12/20 Preadmission Testing Date: 06/01/20 (phone 1p-5p) Covid Testing Date: 06/10/20 - patient advised to go to [facility] ([address] [facility]) between 8a-1p   Also patient needs to call [phone], between 1-3:00pm the day before surgery, to find out what time to arrive for surgery.    Dr. Claudine Mouton wants to see patient back in the clinic for follow up since has rescheduled multiple times.  This appointment has also been made.

## 2020-05-06 NOTE — Telephone Encounter (Signed)
Outgoing call is made again, patient is now informed of her new updated surgery date, pre-admission and Covid testing dates.  She voices understanding.  This is also mailed to her.

## 2020-06-01 ENCOUNTER — Encounter
Admission: RE | Admit: 2020-06-01 | Discharge: 2020-06-01 | Disposition: A | Discharge status: Home / Self Care | Payer: Medicaid Other | Source: Ambulatory Visit | Attending: Surgery | Admitting: Surgery

## 2020-06-01 ENCOUNTER — Other Ambulatory Visit: Payer: Self-pay

## 2020-06-01 DIAGNOSIS — Z01818 Encounter for other preprocedural examination: Secondary | ICD-10-CM | POA: Diagnosis present

## 2020-06-01 HISTORY — DX: Dyspnea, unspecified: R06.00

## 2020-06-01 NOTE — Patient Instructions (Addendum)
INSTRUCTIONS FOR SURGERY     Your surgery is scheduled for:   Friday, AUGUST 20TH     To find out your arrival time for the day of surgery,          please call [phone] between 1 pm and 3 pm on :  Thursday, AUGUST 19TH     When you arrive for surgery, report to the [facility].       Do NOT stop on the first floor to register.    REMEMBER: Instructions that are not followed completely may result in serious medical risk,  up to and including death, or upon the discretion of your surgeon and anesthesiologist,            your surgery may need to be rescheduled.  __X__ 1. Do not eat food after midnight the night before your procedure.                    No gum, candy, lozenger, tic tacs, tums or hard candies.                  ABSOLUTELY NOTHING SOLID IN YOUR MOUTH AFTER MIDNIGHT                    You may drink unlimited clear liquids up to 2 hours before you are scheduled to arrive for surgery.                   Do not drink anything within those 2 hours unless you need to take medicine, then take the                   smallest amount you need.  Clear liquids include:  water, apple juice without pulp,                   any flavor Gatorade, Black coffee, black tea.  Sugar may be added but no dairy/ honey /lemon.                        Broth and jello is not considered a clear liquid.  __x__  2. On the morning of surgery, please brush your teeth with toothpaste and water. You may rinse with                  mouthwash if you wish but DO NOT SWALLOW TOOTHPASTE OR MOUTHWASH  __X___3. NO alcohol for 24 hours before or after surgery.  __x___ 4.  Do NOT smoke or use e-cigarettes for 24 HOURS PRIOR TO SURGERY.                      DO NOT Use any chewable tobacco products for at least 6 hours prior to surgery.  __x___ 5. If you start any new medication after this appointment and prior to surgery, please                    Bring it with you on the day of surgery.  ___x__ 6. Notify your doctor if there is any change in  your medical condition, such as fever,                  infection, vomitting, diarrhea or any open sores.  __x___ 7.  USE the CHG SOAP as instructed, the night before surgery and the day of surgery.                   Once you have washed with this soap, do NOT use any of the following: Powders, perfumes                    or lotions. Please do not wear make up, hairpins, clips or nail polish. You MAY wear                    Deodorant. Women need to shave 48 hours prior to surgery.                   DO NOT wear ANY jewelry on the day of surgery. If there are rings that are too tight to                    remove easily, please address this prior to the surgery day. Piercings need to be removed.                                                                     NO METAL ON YOUR BODY.                    Do NOT bring any valuables.  If you came to Pre-Admit testing then you will not need license,                     insurance card or credit card.  If you will be staying overnight, please either leave your things in                     the car or have your family be responsible for these items.                     [facility] IS NOT RESPONSIBLE FOR BELONGINGS OR VALUABLES.  ___X__ 8. DO NOT wear contact lenses on surgery day.  You may not have dentures,                     Hearing aides, contacts or glasses in the operating room. These items can be                    Placed in the Recovery Room to receive immediately after surgery.  __x___ 9. IF YOU ARE SCHEDULED TO GO HOME ON THE SAME DAY, YOU MUST                   Have someone to drive you home and to stay with you  for the first 24 hours.                    Have an arrangement prior to arriving on surgery day.  ___x__ 10. Take the following medications on the morning of surgery with a sip of  water:                              1. NONE                      2.                      _____ 11.  Follow any instructions provided to you by your surgeon.                        Such as enema, clear liquid bowel prep  __X__  12. STOP ASPIRIN AS OF: ONE WEEK PRIOR TO SURGERY (06/05/20)                       THIS INCLUDES BC POWDERS / GOODIES POWDER  __x___ 13. STOP Anti-inflammatories as of: ONE WEEK PRIOR TO SURGERY (06/05/20)                      This includes IBUPROFEN / MOTRIN / ADVIL / ALEVE/ NAPROXYN                    YOU MAY TAKE TYLENOL ANY TIME PRIOR TO SURGERY.  ___x___18. If staying overnight, please have appropriate shoes to wear to be able to walk around the unit.                   Wear clean and comfortable clothing to the hospital.                     Wear a loose fitting pant or a dress to the hospital.  Please begin taking a stool softener 2 days prior to surgery.

## 2020-06-01 NOTE — Pre-Procedure Instructions (Signed)
Spoke with patient via phone to complete a preop interview.  Patient sounded to have a deep and frequent cough. She states that it is a smokers cough.  Discussed post op TCDB exercises. Patient currently has an issue with constipation. We spoke about her finding a stool softener that she can start a couple days prior to surgery. She did state that she is anxious regarding surgery.

## 2020-06-04 ENCOUNTER — Other Ambulatory Visit: Payer: Self-pay

## 2020-06-04 ENCOUNTER — Telehealth (INDEPENDENT_AMBULATORY_CARE_PROVIDER_SITE_OTHER): Payer: Medicaid Other | Admitting: Surgery

## 2020-06-04 ENCOUNTER — Ambulatory Visit: Payer: Self-pay | Admitting: Surgery

## 2020-06-04 DIAGNOSIS — K432 Incisional hernia without obstruction or gangrene: Secondary | ICD-10-CM | POA: Diagnosis not present

## 2020-06-04 NOTE — Progress Notes (Signed)
I called [name] at her mobile number, due to her inability to get into the office today.  I reviewed her CT scan and reviewed her current tolerance/symptoms associated with this squishy area on her scar.  There is not much of a hernia defect if any to repair.  I know I felt a silk sign on her last exam, but I suspect this may be just either a cystic lesion or perhaps a very small hernia sac through a tiny defect. She has had little or no problem with it, and knows she cannot even palpate this area when she is laying flat.  After further discussion we agreed to defer any elective repair until it became more symptomatic or more problematic or could be addressed simultaneously with another procedure. We reviewed the risks of general anesthesia, and even infection.  She reported that she is gaining some weight significantly over the last few months and has a number of other social issues she would rather address/prioritize. We agreed to cancel next week surgery, and we will have her follow-up as needed should further symptoms/concerns arise.

## 2020-06-10 ENCOUNTER — Other Ambulatory Visit: Payer: Medicaid Other

## 2020-06-12 ENCOUNTER — Encounter: Admission: RE | Payer: Self-pay | Source: Home / Self Care

## 2020-06-12 ENCOUNTER — Ambulatory Visit: Admission: RE | Admit: 2020-06-12 | Payer: Medicaid Other | Source: Home / Self Care | Admitting: Surgery

## 2020-06-12 SURGERY — REPAIR, HERNIA, INCISIONAL
Anesthesia: General

## 2021-05-07 IMAGING — CT CT ABD-PELV W/ CM
2 of 4 series · 16 of 46 positions shown, 18 images · IV contrast (Omni 300)
Comparison: Noncontrast abdominopelvic CT 08/04/2012.

CLINICAL DATA: Ventral incisional hernia.

EXAM:
CT ABDOMEN AND PELVIS WITH CONTRAST
TECHNIQUE: Multidetector CT imaging of the abdomen and pelvis was performed
using the standard protocol following bolus administration of
intravenous contrast.
CONTRAST:  100mL OMNIPAQUE IOHEXOL 300 MG/ML  SOLN

[Series 3: a/p w/ 5mm · axial · 0.81mm/px · z∈[+564,+989]mm · 13 of 93 slices shown, 15 images]
[im 4/93  soft-tissue]
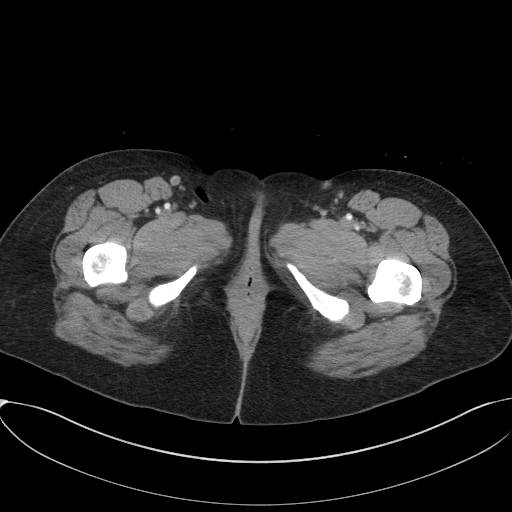
[im 4/93  bone]
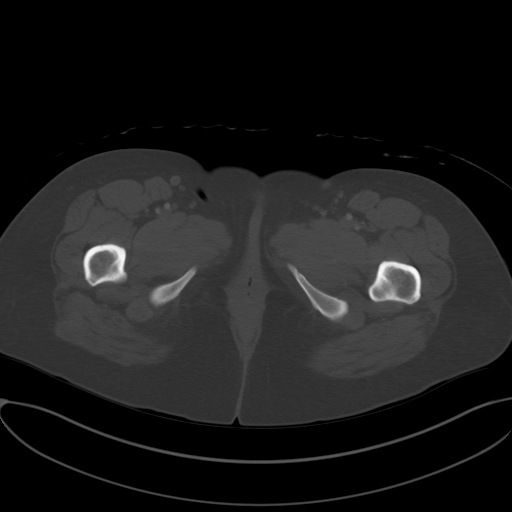
[im 12/93  soft-tissue]
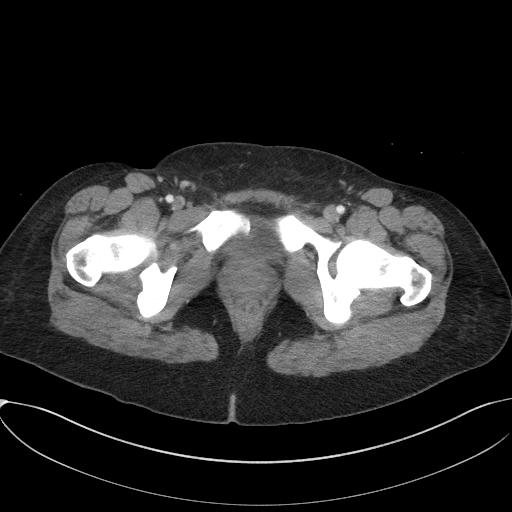
[im 20/93  soft-tissue]
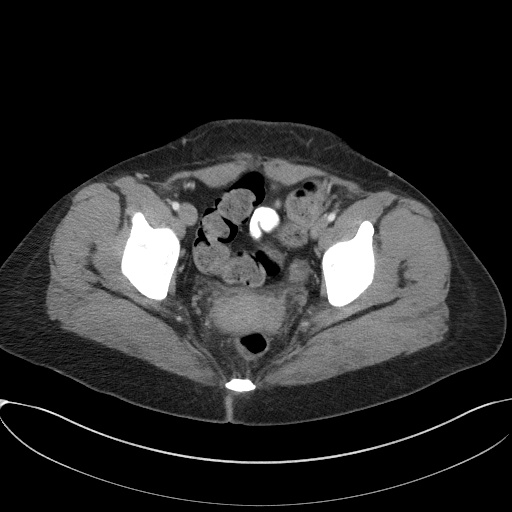
[im 27/93  soft-tissue]
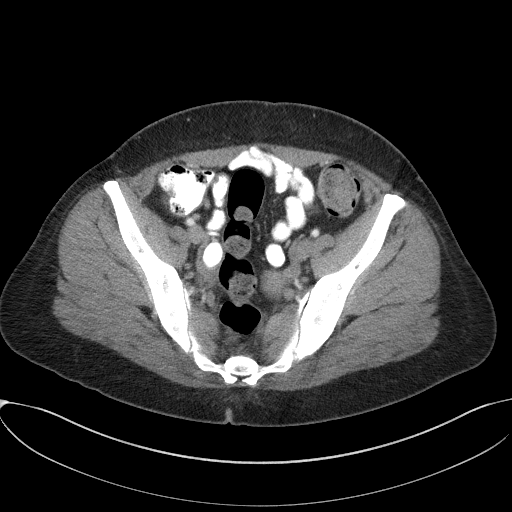
[im 31/93  soft-tissue]
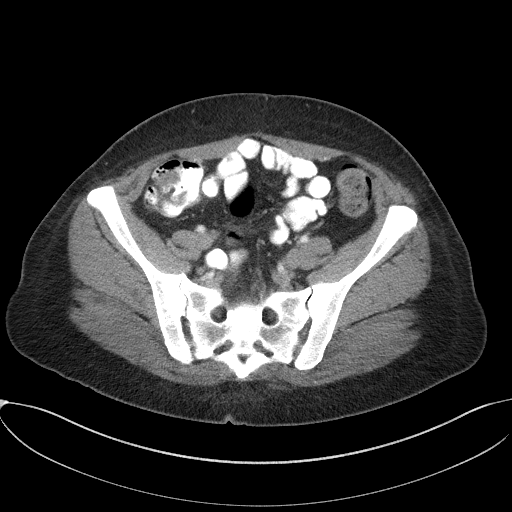
[im 39/93  soft-tissue]
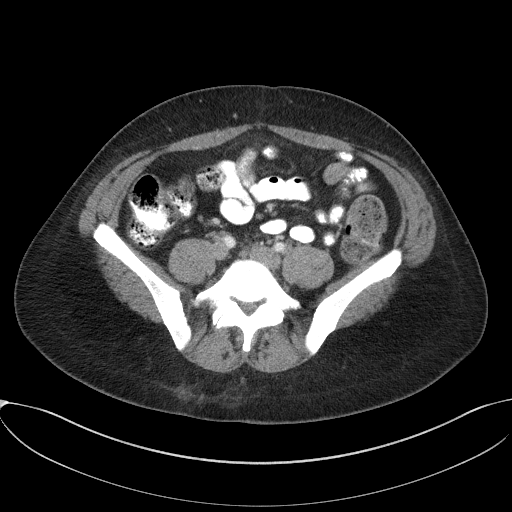
[im 47/93  soft-tissue]
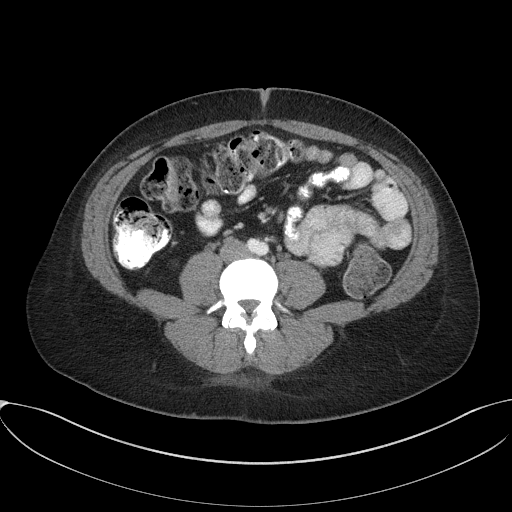
[im 54/93  soft-tissue]
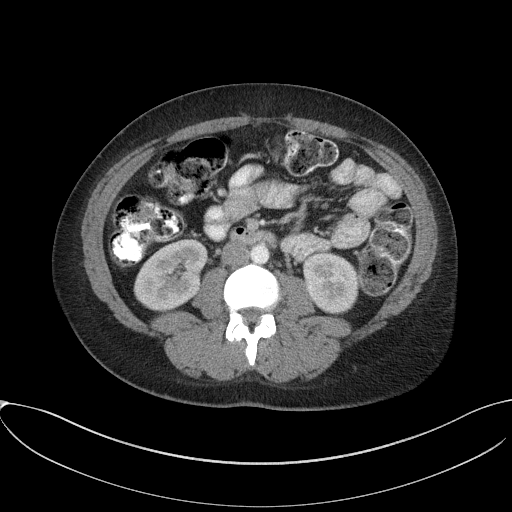
[im 62/93  soft-tissue]
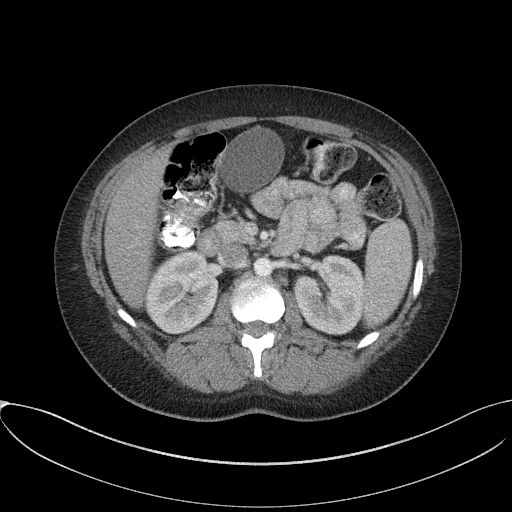
[im 62/93  bone]
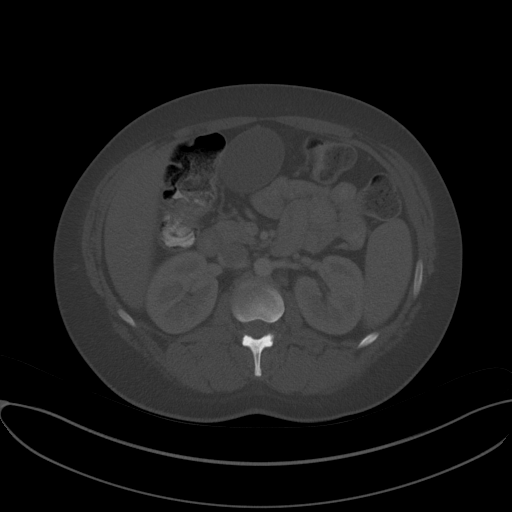
[im 66/93  soft-tissue]
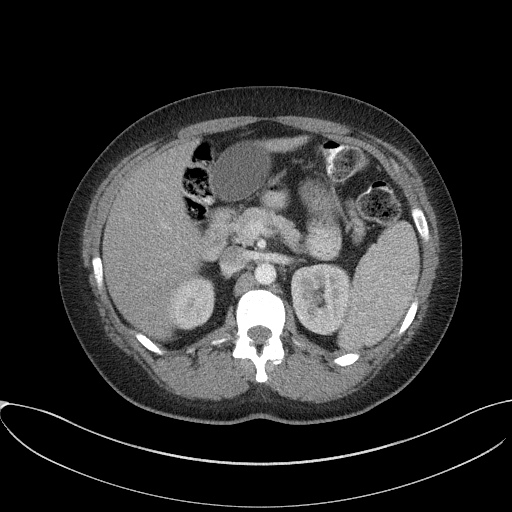
[im 73/93  soft-tissue]
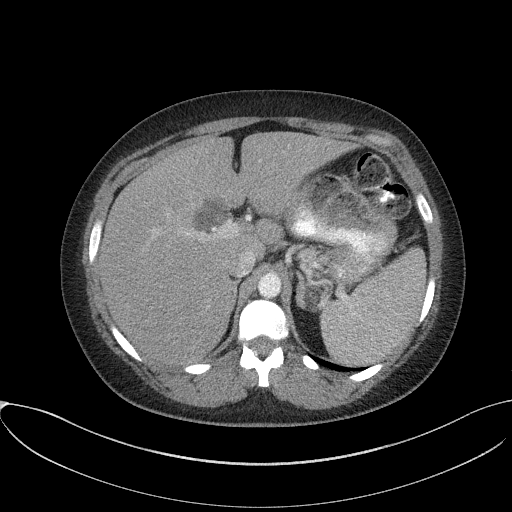
[im 81/93  soft-tissue]
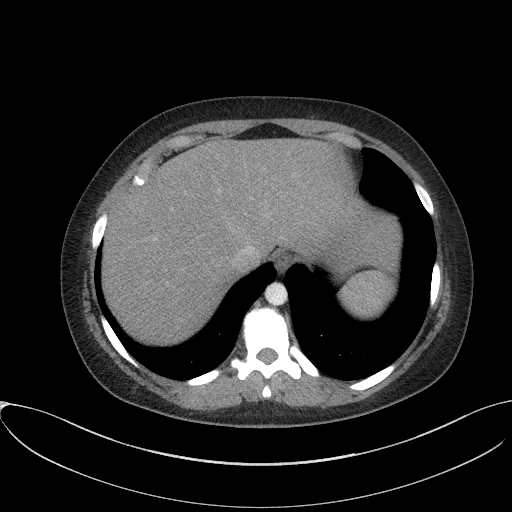
[im 89/93  soft-tissue]
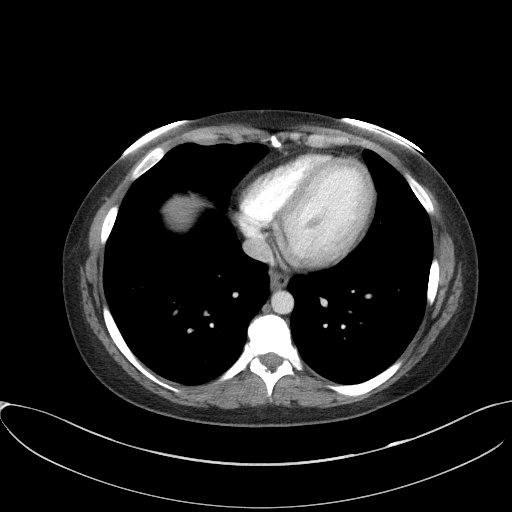

[Series 6: a/p w/ cor · coronal · 0.78mm/px · 3 of 150 slices shown]
[im 50/150  soft-tissue]
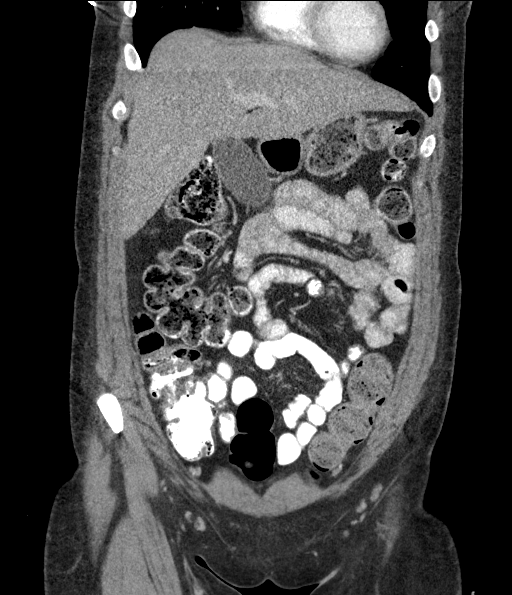
[im 67/150  soft-tissue]
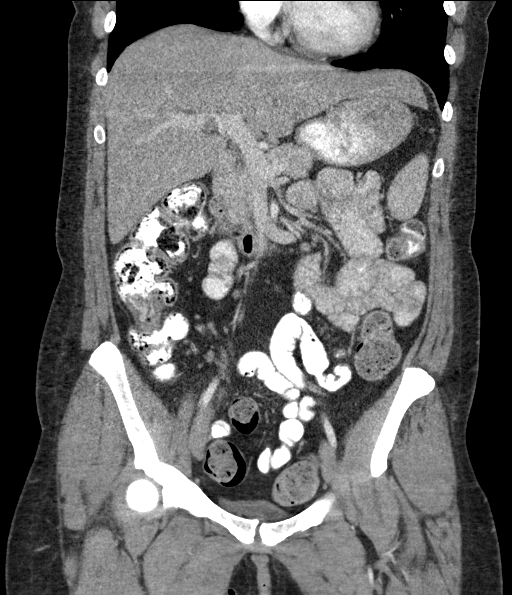
[im 83/150  soft-tissue]
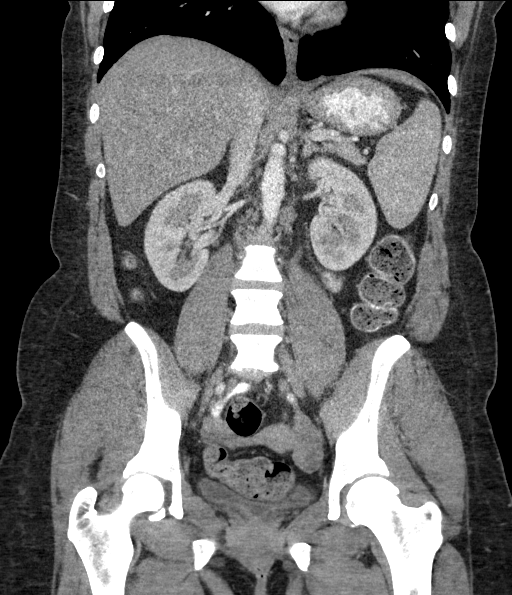

[16 of 46 positions shown; findings below may reference images not displayed]

FINDINGS: Lower chest: Clear lung bases. No significant pleural or pericardial
effusion.

Hepatobiliary: Subjective mild hepatic steatosis as before. No focal
hepatic abnormality. The gallbladder is mildly distended without
wall thickening or surrounding inflammation. No evidence of
gallstones or biliary dilatation.

Pancreas: Unremarkable. No pancreatic ductal dilatation or
surrounding inflammatory changes.

Spleen: Normal in size without focal abnormality.

Adrenals/Urinary Tract: Both adrenal glands appear normal. The
kidneys and ureters appear normal. No evidence of urinary tract
calculus or hydronephrosis. The bladder is nearly empty without
apparent abnormality.

Stomach/Bowel: No evidence of bowel wall thickening, distention or
surrounding inflammatory change. The appendix appears normal.
Moderate stool throughout the colon.

Vascular/Lymphatic: There are no enlarged abdominal or pelvic lymph
nodes. No significant vascular findings.

Reproductive: The uterus and ovaries appear normal. No adnexal mass.

Other: No evidence of abdominal wall mass or hernia. No ascites.

Musculoskeletal: No acute or significant osseous findings.
IMPRESSION: 1. No acute findings or explanation for the patient's symptoms.
2. No evidence of abdominal wall mass or hernia.
3. Subjective mild hepatic steatosis.

## 2021-05-24 ENCOUNTER — Other Ambulatory Visit: Payer: Self-pay

## 2021-05-24 ENCOUNTER — Ambulatory Visit
Admission: EM | Admit: 2021-05-24 | Discharge: 2021-05-24 | Disposition: A | Discharge status: Home / Self Care | Payer: Medicaid Other | Attending: Urgent Care | Admitting: Urgent Care

## 2021-05-24 DIAGNOSIS — R609 Edema, unspecified: Secondary | ICD-10-CM

## 2021-05-24 DIAGNOSIS — Z833 Family history of diabetes mellitus: Secondary | ICD-10-CM

## 2021-05-24 DIAGNOSIS — F17200 Nicotine dependence, unspecified, uncomplicated: Secondary | ICD-10-CM

## 2021-05-24 MED ORDER — FUROSEMIDE 40 MG PO TABS
40.0000 mg | ORAL_TABLET | Freq: Every day | ORAL | 0 refills | Status: AC
Start: 2021-05-24 — End: ?

## 2021-05-24 NOTE — ED Triage Notes (Signed)
3-54mo h/o of BLE swelling. Pt reports the swelling is QD. Swelling is causing ankle pain and interfering with her ability to ambulate. Has been elevating her feet and using cold compresses without relief. No injuries or falls reported.

## 2021-05-24 NOTE — ED Provider Notes (Signed)
[facility]   MRN: [id number] DOB: 05-12-1991  Subjective:   [name] is a 30 y.o. female presenting for several month history of persistent intermittent bilateral lower leg swelling.  Patient states that symptoms occur daily.  Sometimes she has tingling of the feet worse of the right foot.  She can improve the swelling when she props her legs up and uses cold compresses.  No falls, cough, chest pain, shortness of breath, dyspnea, nausea, vomiting, abdominal pain, hematuria, calf pain, redness or warmth of the calves.  No history of heart failure, kidney disease, blood clots.  Patient is not pregnant, states that she has previously had some swelling when she was pregnant but knows she is not pregnant now.  Smokes 1 pack/day.  Also uses marijuana.  No alcohol use.  No rashes.  Does not have a PCP.  No current facility-administered medications for this encounter.  Current Outpatient Medications:    acetaminophen (TYLENOL) 500 MG tablet, Take 1,000 mg by mouth every 6 (six) hours as needed. , Disp: , Rfl:    etonogestrel (NEXPLANON) 68 MG IMPL implant, 68 mg by Subdermal route once. , Disp: , Rfl:    ibuprofen (ADVIL) 400 MG tablet, Take 400 mg by mouth every 6 (six) hours as needed., Disp: , Rfl:    Melatonin 10 MG TABS, Take 1 tablet by mouth as needed. , Disp: , Rfl:    Allergies  Allergen Reactions   Clearlax [Polyethylene Glycol]     Nightmares every day while using this medicine   Poison Sumac Extract Rash    Pretty bad rash    Past Medical History:  Diagnosis Date   ADHD (attention deficit hyperactivity disorder)    Anemia    Dyspnea    GERD (gastroesophageal reflux disease)    h/o during pregnancy   History of kidney stones    h/o. stone passed on its own     Past Surgical History:  Procedure Laterality Date   CESAREAN SECTION N/A 06/03/2016   Procedure: CESAREAN SECTION;  Surgeon: Tereso Newcomer, MD;  Location: [facility];  Service:  Obstetrics;  Laterality: N/A;   MULTIPLE EXTRACTIONS WITH ALVEOLOPLASTY  06/28/2012   Procedure: MULTIPLE EXTRACION WITH ALVEOLOPLASTY;  Surgeon: Georgia Lopes, DDS;  Location: [facility] OR;  Service: Oral Surgery;  Laterality: Bilateral;  extract teeth numbers 2,3,4,5,6,7,8,9,10,11,13,14,19,20,22,23,24,26,27 with alveoplasty all quadrants     Family History  Problem Relation Age of Onset   Lung cancer Paternal Grandfather     Social History   Tobacco Use   Smoking status: Every Day    Packs/day: 1.00    Years: 13.00    Pack years: 13.00    Types: Cigarettes   Smokeless tobacco: Never  Vaping Use   Vaping Use: Never used  Substance Use Topics   Alcohol use: No   Drug use: Yes    Types: Marijuana    Comment: occasional use    ROS   Objective:   Vitals: BP 130/83 (BP Location: Right Arm)   Pulse 98   Temp 98.7 F (37.1 C) (Oral)   Resp 18   SpO2 95%   Physical Exam Constitutional:      General: She is not in acute distress.    Appearance: Normal appearance. She is well-developed and normal weight. She is not ill-appearing, toxic-appearing or diaphoretic.  HENT:     Head: Normocephalic and atraumatic.     Right Ear: External ear normal.     Left Ear:  External ear normal.     Nose: Nose normal.     Mouth/Throat:     Mouth: Mucous membranes are moist.     Pharynx: Oropharynx is clear.  Eyes:     General: No scleral icterus.    Extraocular Movements: Extraocular movements intact.     Pupils: Pupils are equal, round, and reactive to light.  Cardiovascular:     Rate and Rhythm: Normal rate and regular rhythm.     Pulses: Normal pulses.     Heart sounds: Normal heart sounds. No murmur heard.   No friction rub. No gallop.  Pulmonary:     Effort: Pulmonary effort is normal. No respiratory distress.     Breath sounds: Normal breath sounds. No stridor. No wheezing, rhonchi or rales.  Abdominal:     General: Bowel sounds are normal. There is no distension.     Palpations:  Abdomen is soft. There is no mass.     Tenderness: There is no abdominal tenderness. There is no right CVA tenderness, left CVA tenderness, guarding or rebound.  Musculoskeletal:        General: No swelling, tenderness, deformity or signs of injury.     Right lower leg: Edema (1+ up to the distal calf) present.     Left lower leg: Edema (1+ up to the distal calf) present.     Comments: No warmth, erythema.  Skin:    General: Skin is warm and dry.     Coloration: Skin is not pale.     Findings: No rash.  Neurological:     General: No focal deficit present.     Mental Status: She is alert and oriented to person, place, and time.  Psychiatric:        Mood and Affect: Mood normal.        Behavior: Behavior normal.        Thought Content: Thought content normal.        Judgment: Judgment normal.    Assessment and Plan :   PDMP not reviewed this encounter.  1. Edema, unspecified type   2. Smoker   3. Family history of diabetes mellitus     Discussed etiologies for her edema.  Labs pending.  Will use 3 days worth of Lasix as identifying the underlying source will be important in her long-term management.  Discussed this with patient and she is agreeable.  Recommended establishing care with new PCP to continue work-up and management.  No signs of an acute process such as heart failure, DVT. Counseled patient on potential for adverse effects with medications prescribed/recommended today, ER and return-to-clinic precautions discussed, patient verbalized understanding.    Wallis Bamberg, PA-C 05/24/21 1357

## 2021-05-25 LAB — CBC
Hematocrit: 46.2 % (ref 34.0–46.6)
Hemoglobin: 15.8 g/dL (ref 11.1–15.9)
MCH: 29.9 pg (ref 26.6–33.0)
MCHC: 34.2 g/dL (ref 31.5–35.7)
MCV: 87 fL (ref 79–97)
Platelets: 355 10*3/uL (ref 150–450)
RBC: 5.29 x10E6/uL — ABNORMAL HIGH (ref 3.77–5.28)
RDW: 12.7 % (ref 11.7–15.4)
WBC: 14.3 10*3/uL — ABNORMAL HIGH (ref 3.4–10.8)

## 2021-06-29 ENCOUNTER — Ambulatory Visit: Payer: Medicaid Other | Admitting: Family Medicine

## 2021-07-01 ENCOUNTER — Ambulatory Visit: Payer: Medicaid Other | Admitting: Family Medicine

## 2021-07-20 ENCOUNTER — Ambulatory Visit: Payer: Medicaid Other | Admitting: Family Medicine

## 2021-08-31 ENCOUNTER — Ambulatory Visit: Payer: Medicaid Other | Admitting: Family Medicine

## 2021-11-04 ENCOUNTER — Ambulatory Visit
Admission: EM | Admit: 2021-11-04 | Discharge: 2021-11-04 | Disposition: A | Discharge status: Home / Self Care | Payer: Medicaid Other | Attending: Internal Medicine | Admitting: Internal Medicine

## 2021-11-04 ENCOUNTER — Encounter: Payer: Self-pay | Admitting: Emergency Medicine

## 2021-11-04 ENCOUNTER — Other Ambulatory Visit: Payer: Self-pay

## 2021-11-04 ENCOUNTER — Ambulatory Visit (INDEPENDENT_AMBULATORY_CARE_PROVIDER_SITE_OTHER): Payer: Medicaid Other

## 2021-11-04 DIAGNOSIS — M25562 Pain in left knee: Secondary | ICD-10-CM

## 2021-11-04 NOTE — ED Provider Notes (Signed)
[facility]    CSN: [id number] Arrival date & time: 11/04/21  1559      History   Chief Complaint Chief Complaint  Patient presents with   Knee Injury    HPI [name] is a 31 y.o. female.   Patient here today for evaluation of left knee injury that occurred 2 nights ago.  She states that her daughter almost fell off her bed and when she went to catch her her left leg stayed in place and she felt a pop in her knee as she twisted it.  She states that she did not have immediate pain but when she woke the next morning pain was significant and she was unable to go to work.  She has not having some right knee symptoms because she is bearing all of her weight on her right leg.  She does not report any numbness or tingling.  Movement and weightbearing of left knee makes pain worse.  The history is provided by the patient.   Past Medical History:  Diagnosis Date   ADHD (attention deficit hyperactivity disorder)    Anemia    Dyspnea    GERD (gastroesophageal reflux disease)    h/o during pregnancy   History of kidney stones    h/o. stone passed on its own    Patient Active Problem List   Diagnosis Date Noted   Incisional hernia, without obstruction or gangrene 06/04/2020   Obesity (BMI 30.0-34.9) 05/17/2016   Tobacco abuse 05/17/2016   Marijuana abuse 05/17/2016    Past Surgical History:  Procedure Laterality Date   CESAREAN SECTION N/A 06/03/2016   Procedure: CESAREAN SECTION;  Surgeon: Tereso Newcomer, MD;  Location: [facility];  Service: Obstetrics;  Laterality: N/A;   MULTIPLE EXTRACTIONS WITH ALVEOLOPLASTY  06/28/2012   Procedure: MULTIPLE EXTRACION WITH ALVEOLOPLASTY;  Surgeon: Georgia Lopes, DDS;  Location: [facility];  Service: Oral Surgery;  Laterality: Bilateral;  extract teeth numbers 2,3,4,5,6,7,8,9,10,11,13,14,19,20,22,23,24,26,27 with alveoplasty all quadrants     OB History     Gravida  1   Para  1   Term  1   Preterm  0   AB   0   Living  1      SAB  0   IAB  0   Ectopic  0   Multiple  0   Live Births  1            Home Medications    Prior to Admission medications   Medication Sig Start Date End Date Taking? Authorizing Provider  acetaminophen (TYLENOL) 500 MG tablet Take 1,000 mg by mouth every 6 (six) hours as needed.     [provider]  etonogestrel (NEXPLANON) 68 MG IMPL implant 68 mg by Subdermal route once.     [provider]  furosemide (LASIX) 40 MG tablet Take 1 tablet (40 mg total) by mouth daily. 05/24/21   Wallis Bamberg, PA-C  ibuprofen (ADVIL) 400 MG tablet Take 400 mg by mouth every 6 (six) hours as needed.    [provider]  Melatonin 10 MG TABS Take 1 tablet by mouth as needed.     [provider]    Family History Family History  Problem Relation Age of Onset   Lung cancer Paternal Grandfather     Social History Social History   Tobacco Use   Smoking status: Every Day    Packs/day: 1.00    Years: 13.00    Pack years:  13.00    Types: Cigarettes   Smokeless tobacco: Never  Vaping Use   Vaping Use: Never used  Substance Use Topics   Alcohol use: No   Drug use: Yes    Types: Marijuana    Comment: occasional use     Allergies   Clearlax [polyethylene glycol] and Poison sumac extract   Review of Systems Review of Systems  Constitutional:  Negative for chills and fever.  Eyes:  Negative for discharge and redness.  Respiratory:  Negative for shortness of breath.   Gastrointestinal:  Negative for abdominal pain, nausea and vomiting.  Musculoskeletal:  Positive for arthralgias and joint swelling.  Neurological:  Negative for numbness.    Physical Exam Triage Vital Signs ED Triage Vitals  Enc Vitals Group     BP      Pulse      Resp      Temp      Temp src      SpO2      Weight      Height      Head Circumference      Peak Flow      Pain Score      Pain Loc      Pain Edu?      Excl. in [place]?    No data  found.  Updated Vital Signs BP 132/80 (BP Location: Left Arm)    Pulse 91    Temp 97.9 F (36.6 C) (Oral)    Resp 16    SpO2 95%      Physical Exam Vitals and nursing note reviewed.  Constitutional:      General: She is not in acute distress.    Appearance: Normal appearance. She is not ill-appearing.  HENT:     Head: Normocephalic and atraumatic.  Eyes:     Conjunctiva/sclera: Conjunctivae normal.  Cardiovascular:     Rate and Rhythm: Normal rate.  Pulmonary:     Effort: Pulmonary effort is normal.  Musculoskeletal:     Comments: Diffuse swelling noted to left knee.  Decreased range of motion in all directions due to pain.  Tenderness to palpation noted to medial and lateral joint line as well as distal anterior knee.  Neurological:     Mental Status: She is alert.  Psychiatric:        Mood and Affect: Mood normal.        Behavior: Behavior normal.        Thought Content: Thought content normal.     UC Treatments / Results  Labs (all labs ordered are listed, but only abnormal results are displayed) Labs Reviewed - No data to display  EKG   Radiology DG Knee Complete 4 Views Left  Result Date: 11/04/2021 CLINICAL DATA:  Provided history: Twist and pop, pain and mild swelling. EXAM: LEFT KNEE - COMPLETE 4+ VIEW COMPARISON:  No pertinent prior exams available for comparison. FINDINGS: There is normal bony alignment. No evidence of acute osseous or articular abnormality. Mild lateral tibiofemoral compartment joint space degenerative changes with degenerative spurring. IMPRESSION: No evidence of acute osseous or articular abnormality. Mild lateral tibiofemoral compartment joint space degenerative changes with degenerative spurring. Electronically Signed   By: Kellie Simmering D.O.   On: 11/04/2021 16:37    Procedures Procedures (including critical care time)  Medications Ordered in UC Medications - No data to display  Initial Impression / Assessment and Plan / UC Course  I  have reviewed the triage vital signs and the  nursing notes.  Pertinent labs & imaging results that were available during my care of the patient were reviewed by me and considered in my medical decision making (see chart for details).    X-ray without fracture but discussed concern for possible soft tissue injury.  Recommended further evaluation by Ortho as soon as possible and patient placed in knee immobilizer in office.  Final Clinical Impressions(s) / UC Diagnoses   Final diagnoses:  Acute pain of left knee   Discharge Instructions   None    ED Prescriptions   None    PDMP not reviewed this encounter.   Francene Finders, PA-C 11/04/21 1657

## 2021-11-04 NOTE — ED Triage Notes (Signed)
Daughter almost fell off the bed 2 nights ago. Pt went to move to catch her and her left leg stayed in place, and she felt a pop in that knee when she twisted it. Didn't immediately hurt, but when she woke up the next morning the pain was too bad for her to go to work. Also c/o her right knee turning white from having to bear all the weight she's putting onto it

## 2022-01-25 ENCOUNTER — Ambulatory Visit: Payer: Self-pay

## 2022-01-25 ENCOUNTER — Ambulatory Visit
Admission: RE | Admit: 2022-01-25 | Discharge: 2022-01-25 | Disposition: A | Discharge status: Home / Self Care | Payer: Medicaid Other | Source: Ambulatory Visit | Attending: Internal Medicine | Admitting: Internal Medicine

## 2022-01-25 VITALS — BP 134/93 | HR 77 | Temp 98.1°F | Resp 18 | Ht 62.0 in | Wt 200.0 lb

## 2022-01-25 DIAGNOSIS — J069 Acute upper respiratory infection, unspecified: Secondary | ICD-10-CM | POA: Diagnosis not present

## 2022-01-25 MED ORDER — PREDNISONE 20 MG PO TABS: 40.0000 mg | ORAL_TABLET | Freq: Every day | ORAL | 0 refills | Status: AC

## 2022-01-25 MED ORDER — BENZONATATE 100 MG PO CAPS: 100.0000 mg | ORAL_CAPSULE | Freq: Three times a day (TID) | ORAL | 0 refills | Status: AC | PRN

## 2022-01-25 NOTE — ED Provider Notes (Signed)
?[facility] ? ? ? ?CSN: [id number] ?Arrival date & time: 01/25/22  1503 ? ? ?  ? ?History   ?Chief Complaint ?Chief Complaint  ?Patient presents with  ? Cough  ?  Entered by patient  ? Appointment  ? ? ?HPI ?[name] is a 31 y.o. female.  ? ?Patient presents with cough and nasal congestion that has been present for 1.5 weeks.  Her daughter has had similar symptoms recently.  Patient denies any known fevers.  Denies chest pain, shortness of breath, sore throat, ear pain, nausea, vomiting, diarrhea, abdominal pain.  Patient has taken over-the-counter cold and flu medications with minimal improvement in symptoms. ? ? ?Cough ? ?Past Medical History:  ?Diagnosis Date  ? ADHD (attention deficit hyperactivity disorder)   ? Anemia   ? Dyspnea   ? GERD (gastroesophageal reflux disease)   ? h/o during pregnancy  ? History of kidney stones   ? h/o. stone passed on its own  ? ? ?Patient Active Problem List  ? Diagnosis Date Noted  ? Incisional hernia, without obstruction or gangrene 06/04/2020  ? Obesity (BMI 30.0-34.9) 05/17/2016  ? Tobacco abuse 05/17/2016  ? Marijuana abuse 05/17/2016  ? ? ?Past Surgical History:  ?Procedure Laterality Date  ? CESAREAN SECTION N/A 06/03/2016  ? Procedure: CESAREAN SECTION;  Surgeon: Tereso Newcomer, MD;  Location: [facility];  Service: Obstetrics;  Laterality: N/A;  ? MULTIPLE EXTRACTIONS WITH ALVEOLOPLASTY  06/28/2012  ? Procedure: MULTIPLE EXTRACION WITH ALVEOLOPLASTY;  Surgeon: Georgia Lopes, DDS;  Location: [facility] OR;  Service: Oral Surgery;  Laterality: Bilateral;  extract teeth numbers 2,3,4,5,6,7,8,9,10,11,13,14,19,20,22,23,24,26,27 with alveoplasty all quadrants   ? ? ?OB History   ? ? Gravida  ?1  ? Para  ?1  ? Term  ?1  ? Preterm  ?0  ? AB  ?0  ? Living  ?1  ?  ? ? SAB  ?0  ? IAB  ?0  ? Ectopic  ?0  ? Multiple  ?0  ? Live Births  ?1  ?   ?  ?  ? ? ? ?Home Medications   ? ?Prior to Admission medications   ?Medication Sig Start Date End Date Taking? Authorizing  Provider  ?acetaminophen (TYLENOL) 500 MG tablet Take 1,000 mg by mouth every 6 (six) hours as needed.    Yes [provider]  ?benzonatate (TESSALON) 100 MG capsule Take 1 capsule (100 mg total) by mouth every 8 (eight) hours as needed for cough. 01/25/22  Yes Gustavus Bryant, FNP  ?etonogestrel (NEXPLANON) 68 MG IMPL implant 68 mg by Subdermal route once.    Yes [provider]  ?furosemide (LASIX) 40 MG tablet Take 1 tablet (40 mg total) by mouth daily. 05/24/21  Yes Wallis Bamberg, PA-C  ?ibuprofen (ADVIL) 400 MG tablet Take 400 mg by mouth every 6 (six) hours as needed.   Yes [provider]  ?Melatonin 10 MG TABS Take 1 tablet by mouth as needed.    Yes [provider]  ?predniSONE (DELTASONE) 20 MG tablet Take 2 tablets (40 mg total) by mouth daily for 5 days. 01/25/22 01/30/22 Yes Gustavus Bryant, FNP  ? ? ?Family History ?Family History  ?Problem Relation Age of Onset  ? Lung cancer Paternal Grandfather   ? ? ?Social History ?Social History  ? ?Tobacco Use  ? Smoking status: Every Day  ?  Packs/day: 1.00  ?  Years: 13.00  ?  Pack years: 13.00  ?  Types: Cigarettes  ? Smokeless tobacco: Never  ?Vaping Use  ? Vaping Use: Never used  ?Substance Use Topics  ? Alcohol use: No  ? Drug use: Yes  ?  Types: Marijuana  ?  Comment: occasional use  ? ? ? ?Allergies   ?Clearlax [polyethylene glycol] and Poison sumac extract ? ? ?Review of Systems ?Review of Systems ?Per HPI ? ?Physical Exam ?Triage Vital Signs ?ED Triage Vitals  ?Enc Vitals Group  ?   BP 01/25/22 1530 (!) 134/93  ?   Pulse Rate 01/25/22 1530 77  ?   Resp 01/25/22 1530 18  ?   Temp 01/25/22 1530 98.1 ?F (36.7 ?C)  ?   Temp Source 01/25/22 1530 Oral  ?   SpO2 01/25/22 1530 95 %  ?   Weight 01/25/22 1531 200 lb (90.7 kg)  ?   Height 01/25/22 1531 5\' 2"  (1.575 m)  ?   Head Circumference --   ?   Peak Flow --   ?   Pain Score 01/25/22 1530 0  ?   Pain Loc --   ?   Pain Edu? --   ?   Excl. in [place]? --   ? ?No data found. ? ?Updated Vital  Signs ?BP (!) 134/93 (BP Location: Left Arm)   Pulse 77   Temp 98.1 ?F (36.7 ?C) (Oral)   Resp 18   Ht 5\' 2"  (1.575 m)   Wt 200 lb (90.7 kg)   SpO2 95%   BMI 36.58 kg/m?  ? ?Visual Acuity ?Right Eye Distance:   ?Left Eye Distance:   ?Bilateral Distance:   ? ?Right Eye Near:   ?Left Eye Near:    ?Bilateral Near:    ? ?Physical Exam ?Constitutional:   ?   General: She is not in acute distress. ?   Appearance: Normal appearance. She is not toxic-appearing or diaphoretic.  ?HENT:  ?   Head: Normocephalic and atraumatic.  ?   Right Ear: Tympanic membrane and ear canal normal.  ?   Left Ear: Tympanic membrane and ear canal normal.  ?   Nose: Congestion present.  ?   Mouth/Throat:  ?   Mouth: Mucous membranes are moist.  ?   Pharynx: No posterior oropharyngeal erythema.  ?Eyes:  ?   Extraocular Movements: Extraocular movements intact.  ?   Conjunctiva/sclera: Conjunctivae normal.  ?   Pupils: Pupils are equal, round, and reactive to light.  ?Cardiovascular:  ?   Rate and Rhythm: Normal rate and regular rhythm.  ?   Pulses: Normal pulses.  ?   Heart sounds: Normal heart sounds.  ?Pulmonary:  ?   Effort: Pulmonary effort is normal. No respiratory distress.  ?   Breath sounds: Normal breath sounds. No stridor. No wheezing, rhonchi or rales.  ?Abdominal:  ?   General: Abdomen is flat. Bowel sounds are normal.  ?   Palpations: Abdomen is soft.  ?Musculoskeletal:     ?   General: Normal range of motion.  ?   Cervical back: Normal range of motion.  ?Skin: ?   General: Skin is warm and dry.  ?Neurological:  ?   General: No focal deficit present.  ?   Mental Status: She is alert and oriented to person, place, and time. Mental status is at baseline.  ?Psychiatric:     ?   Mood and Affect: Mood normal.     ?   Behavior: Behavior normal.  ? ? ? ?UC Treatments / Results  ?  Labs ?(all labs ordered are listed, but only abnormal results are displayed) ?Labs Reviewed - No data to display ? ?EKG ? ? ?Radiology ?No results  found. ? ?Procedures ?Procedures (including critical care time) ? ?Medications Ordered in UC ?Medications - No data to display ? ?Initial Impression / Assessment and Plan / UC Course  ?I have reviewed the triage vital signs and the nursing notes. ? ?Pertinent labs & imaging results that were available during my care of the patient were reviewed by me and considered in my medical decision making (see chart for details). ? ?  ? ?Patient presents with symptoms likely from a viral upper respiratory infection. Differential includes bacterial pneumonia, sinusitis, allergic rhinitis, COVID-19, flu. Do not suspect underlying cardiopulmonary process. Symptoms seem unlikely related to ACS, CHF or COPD exacerbations, pneumonia, pneumothorax. Patient is nontoxic appearing and not in need of emergent medical intervention. ? ?Recommended symptom control with over the counter medications.  I do think that prednisone would be beneficial to patient.  Patient has Lasix listed on her med list but reports that she does not take this anymore and took it in the past due to swelling in the legs.  She has taken prednisone before and tolerated well so I do think this is safe. ? ?Return if symptoms fail to improve in 1-2 weeks or you develop shortness of breath, chest pain, severe headache. Patient states understanding and is agreeable. ? ?Discharged with PCP followup.  ?Final Clinical Impressions(s) / UC Diagnoses  ? ?Final diagnoses:  ?Viral upper respiratory tract infection with cough  ? ? ? ?Discharge Instructions   ? ?  ?It appears that you have a viral illness with possible bronchitis.  This is being treated with prednisone and a cough medication. ? ? ? ?ED Prescriptions   ? ? Medication Sig Dispense Auth. Provider  ? predniSONE (DELTASONE) 20 MG tablet Take 2 tablets (40 mg total) by mouth daily for 5 days. 10 tablet [place], [place] E, [place]  ? benzonatate (TESSALON) 100 MG capsule Take 1 capsule (100 mg total) by mouth every 8 (eight)  hours as needed for cough. 21 capsule Ervin Knack E, [place]  ? ?  ? ?PDMP not reviewed this encounter. ?  ?Gustavus Bryant, [place] ?01/25/22 1555 ? ?

## 2022-01-25 NOTE — Discharge Instructions (Signed)
It appears that you have a viral illness with possible bronchitis.  This is being treated with prednisone and a cough medication. ?

## 2022-01-25 NOTE — ED Triage Notes (Signed)
Patient c/o productive cough x for several weeks, congestion, some runny nose.  Patient employer informed her that she needs to be seen before returning to work. ?

## 2022-08-31 ENCOUNTER — Ambulatory Visit (INDEPENDENT_AMBULATORY_CARE_PROVIDER_SITE_OTHER): Payer: Medicaid Other

## 2022-08-31 ENCOUNTER — Ambulatory Visit
Admission: RE | Admit: 2022-08-31 | Discharge: 2022-08-31 | Disposition: A | Discharge status: Home / Self Care | Payer: Medicaid Other | Source: Ambulatory Visit | Attending: Internal Medicine | Admitting: Internal Medicine

## 2022-08-31 VITALS — BP 136/95 | HR 78 | Temp 98.1°F | Resp 20

## 2022-08-31 DIAGNOSIS — J069 Acute upper respiratory infection, unspecified: Secondary | ICD-10-CM | POA: Diagnosis not present

## 2022-08-31 DIAGNOSIS — R059 Cough, unspecified: Secondary | ICD-10-CM

## 2022-08-31 MED ORDER — ALBUTEROL SULFATE HFA 108 (90 BASE) MCG/ACT IN AERS: 1.0000 | INHALATION_SPRAY | Freq: Four times a day (QID) | RESPIRATORY_TRACT | 0 refills | Status: AC | PRN

## 2022-08-31 MED ORDER — PREDNISONE 20 MG PO TABS: 40.0000 mg | ORAL_TABLET | Freq: Every day | ORAL | 0 refills | Status: AC

## 2022-08-31 NOTE — ED Triage Notes (Signed)
Pt c/o headache, cough,   Onset ~ thurs

## 2022-08-31 NOTE — Discharge Instructions (Signed)
You have a viral upper respiratory infection that should run its course and self resolve with symptomatic treatment.  Your chest x-ray was normal.  I have prescribed you prednisone and albuterol inhaler to alleviate symptoms.  Please follow-up if symptoms persist or worsen.

## 2022-08-31 NOTE — ED Provider Notes (Signed)
[facility]    CSN: [id number] Arrival date & time: 08/31/22  1017      History   Chief Complaint No chief complaint on file.   HPI [name] is a 31 y.o. female.   Patient presents with nasal congestion and cough that has been present for about 6 days.  She reports that her sister has had similar symptoms.  She has been taking Mucinex over-the-counter with minimal improvement.  Denies chest pain, shortness of breath, sore throat, ear pain, nausea, vomiting, diarrhea, abdominal pain.  Denies any known fevers at home.     Past Medical History:  Diagnosis Date   ADHD (attention deficit hyperactivity disorder)    Anemia    Dyspnea    GERD (gastroesophageal reflux disease)    h/o during pregnancy   History of kidney stones    h/o. stone passed on its own    Patient Active Problem List   Diagnosis Date Noted   Incisional hernia, without obstruction or gangrene 06/04/2020   Obesity (BMI 30.0-34.9) 05/17/2016   Tobacco abuse 05/17/2016   Marijuana abuse 05/17/2016    Past Surgical History:  Procedure Laterality Date   CESAREAN SECTION N/A 06/03/2016   Procedure: CESAREAN SECTION;  Surgeon: Tereso Newcomer, MD;  Location: [facility];  Service: Obstetrics;  Laterality: N/A;   MULTIPLE EXTRACTIONS WITH ALVEOLOPLASTY  06/28/2012   Procedure: MULTIPLE EXTRACION WITH ALVEOLOPLASTY;  Surgeon: Georgia Lopes, DDS;  Location: [facility];  Service: Oral Surgery;  Laterality: Bilateral;  extract teeth numbers 2,3,4,5,6,7,8,9,10,11,13,14,19,20,22,23,24,26,27 with alveoplasty all quadrants     OB History     Gravida  1   Para  1   Term  1   Preterm  0   AB  0   Living  1      SAB  0   IAB  0   Ectopic  0   Multiple  0   Live Births  1            Home Medications    Prior to Admission medications   Medication Sig Start Date End Date Taking? Authorizing Provider  albuterol (VENTOLIN HFA) 108 (90 Base) MCG/ACT inhaler Inhale 1-2  puffs into the lungs every 6 (six) hours as needed for wheezing or shortness of breath. 08/31/22  Yes Jerzy Roepke, Rolly Salter E, FNP  predniSONE (DELTASONE) 20 MG tablet Take 2 tablets (40 mg total) by mouth daily for 5 days. 08/31/22 09/05/22 Yes Quaid Yeakle, Acie Fredrickson, FNP  acetaminophen (TYLENOL) 500 MG tablet Take 1,000 mg by mouth every 6 (six) hours as needed.     [provider]  benzonatate (TESSALON) 100 MG capsule Take 1 capsule (100 mg total) by mouth every 8 (eight) hours as needed for cough. 01/25/22   Gustavus Bryant, FNP  etonogestrel (NEXPLANON) 68 MG IMPL implant 68 mg by Subdermal route once.     [provider]  furosemide (LASIX) 40 MG tablet Take 1 tablet (40 mg total) by mouth daily. 05/24/21   Wallis Bamberg, PA-C  ibuprofen (ADVIL) 400 MG tablet Take 400 mg by mouth every 6 (six) hours as needed.    [provider]  Melatonin 10 MG TABS Take 1 tablet by mouth as needed.     [provider]    Family History Family History  Problem Relation Age of Onset   Lung cancer Paternal Grandfather     Social History Social History   Tobacco Use   Smoking status: Every  Day    Packs/day: 1.00    Years: 13.00    Total pack years: 13.00    Types: Cigarettes   Smokeless tobacco: Never  Vaping Use   Vaping Use: Never used  Substance Use Topics   Alcohol use: No   Drug use: Yes    Types: Marijuana    Comment: occasional use     Allergies   Clearlax [polyethylene glycol] and Poison sumac extract   Review of Systems Review of Systems Per HPI  Physical Exam Triage Vital Signs ED Triage Vitals  Enc Vitals Group     BP 08/31/22 1121 (!) 136/95     Pulse Rate 08/31/22 1121 78     Resp 08/31/22 1121 20     Temp 08/31/22 1121 98.1 F (36.7 C)     Temp Source 08/31/22 1121 Oral     SpO2 08/31/22 1121 95 %     Weight --      Height --      Head Circumference --      Peak Flow --      Pain Score 08/31/22 1122 8     Pain Loc --      Pain Edu? --       Excl. in [place]? --    No data found.  Updated Vital Signs BP (!) 136/95 (BP Location: Left Arm)   Pulse 78   Temp 98.1 F (36.7 C) (Oral)   Resp 20   SpO2 95%   Visual Acuity Right Eye Distance:   Left Eye Distance:   Bilateral Distance:    Right Eye Near:   Left Eye Near:    Bilateral Near:     Physical Exam Constitutional:      General: She is not in acute distress.    Appearance: Normal appearance. She is not toxic-appearing or diaphoretic.  HENT:     Head: Normocephalic and atraumatic.     Right Ear: Tympanic membrane and ear canal normal.     Left Ear: Tympanic membrane and ear canal normal.     Nose: Congestion present.     Mouth/Throat:     Mouth: Mucous membranes are moist.     Pharynx: No posterior oropharyngeal erythema.  Eyes:     Extraocular Movements: Extraocular movements intact.     Conjunctiva/sclera: Conjunctivae normal.     Pupils: Pupils are equal, round, and reactive to light.  Cardiovascular:     Rate and Rhythm: Normal rate and regular rhythm.     Pulses: Normal pulses.     Heart sounds: Normal heart sounds.  Pulmonary:     Effort: Pulmonary effort is normal. No respiratory distress.     Breath sounds: No stridor. Rhonchi present. No wheezing or rales.  Chest:     Chest wall: No tenderness.  Abdominal:     General: Abdomen is flat. Bowel sounds are normal.     Palpations: Abdomen is soft.  Musculoskeletal:        General: Normal range of motion.     Cervical back: Normal range of motion.  Skin:    General: Skin is warm and dry.  Neurological:     General: No focal deficit present.     Mental Status: She is alert and oriented to person, place, and time. Mental status is at baseline.  Psychiatric:        Mood and Affect: Mood normal.        Behavior: Behavior normal.      UC  Treatments / Results  Labs (all labs ordered are listed, but only abnormal results are displayed) Labs Reviewed - No data to display  EKG   Radiology DG  Chest 2 View  Result Date: 08/31/2022 CLINICAL DATA:  Headache, cough EXAM: CHEST - 2 VIEW COMPARISON:  Chest radiograph 10/13/2014 FINDINGS: The cardiomediastinal silhouette is normal. There is no focal consolidation or pulmonary edema. There is no pleural effusion or pneumothorax. There is no acute osseous abnormality. IMPRESSION: No radiographic evidence of acute cardiopulmonary process. Electronically Signed   By: Lesia Hausen M.D.   On: 08/31/2022 11:51    Procedures Procedures (including critical care time)  Medications Ordered in UC Medications - No data to display  Initial Impression / Assessment and Plan / UC Course  I have reviewed the triage vital signs and the nursing notes.  Pertinent labs & imaging results that were available during my care of the patient were reviewed by me and considered in my medical decision making (see chart for details).     Patient presents with symptoms likely from a viral upper respiratory infection. Differential includes bacterial pneumonia, sinusitis, allergic rhinitis, COVID-19, flu, RSV.  Chest x-ray completed that was negative for any acute cardiopulmonary process.  Patient is nontoxic appearing and not in need of emergent medical intervention.  Patient declined COVID testing.  Patient's lung sounds are rhonchus versus mild wheezing.  I do think the patient would benefit from prednisone and albuterol inhaler.  Patient has taken prednisone before and tolerated well.  No obvious contraindications to prednisone in patient's history.  Recommended symptom control with over the counter medications as well.  Return if symptoms fail to improve. Patient states understanding and is agreeable.  Discharged with PCP followup.  Final Clinical Impressions(s) / UC Diagnoses   Final diagnoses:  Viral upper respiratory tract infection with cough     Discharge Instructions      You have a viral upper respiratory infection that should run its course and self  resolve with symptomatic treatment.  Your chest x-ray was normal.  I have prescribed you prednisone and albuterol inhaler to alleviate symptoms.  Please follow-up if symptoms persist or worsen.     ED Prescriptions     Medication Sig Dispense Auth. Provider   predniSONE (DELTASONE) 20 MG tablet Take 2 tablets (40 mg total) by mouth daily for 5 days. 10 tablet [place], Rolly Salter E, [place]   albuterol (VENTOLIN HFA) 108 (90 Base) MCG/ACT inhaler Inhale 1-2 puffs into the lungs every 6 (six) hours as needed for wheezing or shortness of breath. 1 each Gustavus Bryant, [place]      PDMP not reviewed this encounter.   Gustavus Bryant, [place] 08/31/22 [phone]

## 2022-09-13 ENCOUNTER — Emergency Department (HOSPITAL_BASED_OUTPATIENT_CLINIC_OR_DEPARTMENT_OTHER): Payer: Medicaid Other

## 2022-09-13 ENCOUNTER — Emergency Department (HOSPITAL_BASED_OUTPATIENT_CLINIC_OR_DEPARTMENT_OTHER)
Admission: EM | Admit: 2022-09-13 | Discharge: 2022-09-13 | Disposition: A | Discharge status: Home / Self Care | Payer: Medicaid Other | Attending: Emergency Medicine | Admitting: Emergency Medicine

## 2022-09-13 ENCOUNTER — Encounter (HOSPITAL_BASED_OUTPATIENT_CLINIC_OR_DEPARTMENT_OTHER): Payer: Self-pay

## 2022-09-13 ENCOUNTER — Ambulatory Visit
Admission: RE | Admit: 2022-09-13 | Discharge: 2022-09-13 | Disposition: A | Discharge status: Other Acute Inpatient Hospital | Payer: Medicaid Other | Source: Ambulatory Visit

## 2022-09-13 DIAGNOSIS — D72829 Elevated white blood cell count, unspecified: Secondary | ICD-10-CM | POA: Diagnosis not present

## 2022-09-13 DIAGNOSIS — J019 Acute sinusitis, unspecified: Secondary | ICD-10-CM | POA: Diagnosis not present

## 2022-09-13 DIAGNOSIS — B9689 Other specified bacterial agents as the cause of diseases classified elsewhere: Secondary | ICD-10-CM

## 2022-09-13 DIAGNOSIS — R519 Headache, unspecified: Secondary | ICD-10-CM | POA: Diagnosis present

## 2022-09-13 LAB — BASIC METABOLIC PANEL
Anion gap: 9 (ref 5–15)
BUN: 6 mg/dL (ref 6–20)
CO2: 26 mmol/L (ref 22–32)
Calcium: 9.6 mg/dL (ref 8.9–10.3)
Chloride: 102 mmol/L (ref 98–111)
Creatinine, Ser: 0.5 mg/dL (ref 0.44–1.00)
GFR, Estimated: >60 mL/min (ref >60)
Glucose, Bld: 108 mg/dL — ABNORMAL HIGH (ref 70–99)
Potassium: 4 mmol/L (ref 3.5–5.1)
Sodium: 137 mmol/L (ref 135–145)

## 2022-09-13 LAB — CBC WITH DIFFERENTIAL/PLATELET
Abs Immature Granulocytes: 0.05 10*3/uL (ref 0.00–0.07)
Basophils Absolute: 0 10*3/uL (ref 0.0–0.1)
Basophils Relative: 0 %
Eosinophils Absolute: 0.1 10*3/uL (ref 0.0–0.5)
Eosinophils Relative: 1 %
HCT: 41.9 % (ref 36.0–46.0)
Hemoglobin: 13.8 g/dL (ref 12.0–15.0)
Immature Granulocytes: 0 %
Lymphocytes Relative: 14 %
Lymphs Abs: 2.7 10*3/uL (ref 0.7–4.0)
MCH: 29 pg (ref 26.0–34.0)
MCHC: 32.9 g/dL (ref 30.0–36.0)
MCV: 88 fL (ref 80.0–100.0)
Monocytes Absolute: 1.3 10*3/uL — ABNORMAL HIGH (ref 0.1–1.0)
Monocytes Relative: 7 %
Neutro Abs: 15 10*3/uL — ABNORMAL HIGH (ref 1.7–7.7)
Neutrophils Relative %: 78 %
Platelets: 297 10*3/uL (ref 150–400)
RBC: 4.76 MIL/uL (ref 3.87–5.11)
RDW: 12.5 % (ref 11.5–15.5)
WBC: 19.2 10*3/uL — ABNORMAL HIGH (ref 4.0–10.5)
nRBC: 0 % (ref 0.0–0.2)

## 2022-09-13 LAB — HCG, QUANTITATIVE, PREGNANCY: hCG, Beta Chain, Quant, S: <1 m[IU]/mL (ref <5)

## 2022-09-13 MED ORDER — SALINE SPRAY 0.65 % NA SOLN: 1.0000 | NASAL | 0 refills | Status: AC | PRN

## 2022-09-13 MED ORDER — HYDROCODONE-ACETAMINOPHEN 5-325 MG PO TABS: 1.0000 | ORAL_TABLET | Freq: Four times a day (QID) | ORAL | 0 refills | Status: AC | PRN

## 2022-09-13 MED ORDER — FENTANYL CITRATE PF 50 MCG/ML IJ SOSY: 50.0000 ug | PREFILLED_SYRINGE | Freq: Once | INTRAMUSCULAR | Status: DC

## 2022-09-13 MED ORDER — HYDROCODONE-ACETAMINOPHEN 5-325 MG PO TABS: 1.0000 | ORAL_TABLET | Freq: Four times a day (QID) | ORAL | 0 refills | Status: DC | PRN

## 2022-09-13 MED ORDER — AMOXICILLIN-POT CLAVULANATE 875-125 MG PO TABS: 1.0000 | ORAL_TABLET | Freq: Two times a day (BID) | ORAL | 0 refills | Status: DC

## 2022-09-13 MED ORDER — AMOXICILLIN-POT CLAVULANATE 875-125 MG PO TABS: 1.0000 | ORAL_TABLET | Freq: Two times a day (BID) | ORAL | 0 refills | Status: AC

## 2022-09-13 MED ORDER — IOHEXOL 300 MG/ML  SOLN
75.0000 mL | Freq: Once | INTRAMUSCULAR | Status: AC | PRN
  Administered 2022-09-13: 75 mL via INTRAVENOUS

## 2022-09-13 MED ORDER — LACTATED RINGERS IV BOLUS: 1000.0000 mL | Freq: Once | INTRAVENOUS | Status: DC

## 2022-09-13 MED ORDER — SODIUM CHLORIDE 0.9 % IV SOLN: 3.0000 g | Freq: Once | INTRAVENOUS | Status: DC

## 2022-09-13 MED ORDER — HYDROCODONE-ACETAMINOPHEN 5-325 MG PO TABS
1.0000 | ORAL_TABLET | Freq: Once | ORAL | Status: AC
  Administered 2022-09-13: 1 via ORAL
  Filled 2022-09-13: qty 1

## 2022-09-13 MED ORDER — AMOXICILLIN-POT CLAVULANATE 875-125 MG PO TABS
1.0000 | ORAL_TABLET | Freq: Once | ORAL | Status: AC
  Administered 2022-09-13: 1 via ORAL
  Filled 2022-09-13: qty 1

## 2022-09-13 NOTE — Discharge Instructions (Addendum)
Your CT imaging revealed evidence of bacterial sinusitis which is likely causing your facial pain.  There is no other evidence of infection elsewhere in your face.

## 2022-09-13 NOTE — ED Triage Notes (Addendum)
Pt crying and states facial pain started Sunday AM Pt has taken numerous meds OTC Pt does not have any teeth Pt was seen at urgent care and was sent here.   Denies numbness or tingling Left side of face is slightly swollen no redness  Also reports she was dx with URI 2 weeks ago but could not afford prescriptions

## 2022-09-13 NOTE — ED Triage Notes (Addendum)
[facility] presents [facility] with [facility] of L sided facial pain since Sunday. Pt reports "pain is 100/10" pt st it feels like a tooth ache but she has no teeth. Pt reports taking Excedrin and allergy medications with minimal improvement.    Pt denies any n/v and numbness.

## 2022-09-13 NOTE — ED Notes (Signed)
Patient is being discharged from the Urgent Care and sent to the Emergency Department via pov with grandmother . Per myers, pa , patient is in need of higher level of care due to extreme ha pain. Patient is aware and verbalizes understanding of plan of care.  Vitals:   09/13/22 1613  BP: (!) 174/117  Pulse: 91  Resp: 18  Temp: 98.3 F (36.8 C)  SpO2: 98%

## 2022-09-13 NOTE — ED Provider Notes (Signed)
[facility] EMERGENCY DEPT Provider Note   CSN: [id number] Arrival date & time: 09/13/22  1649     History  Chief Complaint  Patient presents with   Facial Pain    [name] is a 31 y.o. female.  HPI   31 year old female presenting to the department with left-sided facial pain.  The patient states that for the last 2 weeks or so she has had nasal congestion and symptoms of sinusitis.  She states that over the last couple of days the pain has acutely worsened.  Endorses sharp pain in the left side of her face.  She states that the pain worsens starting on Sunday.  She has tried multiple over-the-counter pain medications.  She currently has no teeth as they have all been pulled previously by dentist.  She endorses swelling in the left side of her face.  Denies any numbness, tingling, swelling or pain with extraocular movements, swelling around the eye.  She denies any facial trauma.  Home Medications Prior to Admission medications   Medication Sig Start Date End Date Taking? Authorizing Provider  sodium chloride (OCEAN) 0.65 % SOLN nasal spray Place 1 spray into both nostrils as needed for congestion. 09/13/22  Yes Ernie Avena, MD  acetaminophen (TYLENOL) 500 MG tablet Take 1,000 mg by mouth every 6 (six) hours as needed.     [provider]  albuterol (VENTOLIN HFA) 108 (90 Base) MCG/ACT inhaler Inhale 1-2 puffs into the lungs every 6 (six) hours as needed for wheezing or shortness of breath. 08/31/22   Gustavus Bryant, FNP  amoxicillin-clavulanate (AUGMENTIN) 875-125 MG tablet Take 1 tablet by mouth every 12 (twelve) hours. 09/13/22   Ernie Avena, MD  benzonatate (TESSALON) 100 MG capsule Take 1 capsule (100 mg total) by mouth every 8 (eight) hours as needed for cough. 01/25/22   Gustavus Bryant, FNP  etonogestrel (NEXPLANON) 68 MG IMPL implant 68 mg by Subdermal route once.     [provider]  furosemide (LASIX) 40 MG tablet Take 1 tablet (40  mg total) by mouth daily. 05/24/21   Wallis Bamberg, PA-C  HYDROcodone-acetaminophen (NORCO/VICODIN) 5-325 MG tablet Take 1-2 tablets by mouth every 6 (six) hours as needed. 09/13/22   Ernie Avena, MD  ibuprofen (ADVIL) 400 MG tablet Take 400 mg by mouth every 6 (six) hours as needed.    [provider]  Melatonin 10 MG TABS Take 1 tablet by mouth as needed.     [provider]      Allergies    Clearlax [polyethylene glycol] and Poison sumac extract    Review of Systems   Review of Systems  All other systems reviewed and are negative.   Physical Exam Updated Vital Signs BP (!) 166/104 (BP Location: Right Arm)   Pulse 94   Temp 97.9 F (36.6 C)   Resp 20   Ht 5\' 2"  (1.575 m)   Wt 72.6 kg   SpO2 100%   BMI 29.26 kg/m  Physical Exam Vitals and nursing note reviewed.  Constitutional:      General: She is not in acute distress. HENT:     Head: Normocephalic and atraumatic.     Nose:     Left Sinus: Maxillary sinus tenderness present.  Eyes:     Extraocular Movements: Extraocular movements intact.     Conjunctiva/sclera: Conjunctivae normal.     Pupils: Pupils are equal, round, and reactive to light.     Comments: EOMs intact without pain  Cardiovascular:     Rate and Rhythm: Normal rate and regular rhythm.  Pulmonary:     Effort: Pulmonary effort is normal. No respiratory distress.  Abdominal:     General: There is no distension.     Tenderness: There is no guarding.  Musculoskeletal:        General: No deformity or signs of injury.     Cervical back: Neck supple.  Skin:    Findings: No lesion or rash.  Neurological:     General: No focal deficit present.     Mental Status: She is alert. Mental status is at baseline.     ED Results / Procedures / Treatments   Labs (all labs ordered are listed, but only abnormal results are displayed) Labs Reviewed  CBC WITH DIFFERENTIAL/PLATELET - Abnormal; Notable for the following components:      Result  Value   WBC 19.2 (*)    Neutro Abs 15.0 (*)    Monocytes Absolute 1.3 (*)    All other components within normal limits  BASIC METABOLIC PANEL - Abnormal; Notable for the following components:   Glucose, Bld 108 (*)    All other components within normal limits  CULTURE, BLOOD (ROUTINE X 2)  CULTURE, BLOOD (ROUTINE X 2)  HCG, QUANTITATIVE, PREGNANCY    EKG None  Radiology CT Maxillofacial W Contrast  Result Date: 09/13/2022 CLINICAL DATA:  Left facial pain and swelling. EXAM: CT MAXILLOFACIAL WITH CONTRAST TECHNIQUE: Multidetector CT imaging of the maxillofacial structures was performed with intravenous contrast. Multiplanar CT image reconstructions were also generated. RADIATION DOSE REDUCTION: This exam was performed according to the departmental dose-optimization program which includes automated exposure control, adjustment of the mA and/or kV according to patient size and/or use of iterative reconstruction technique. CONTRAST:  61mL OMNIPAQUE IOHEXOL 300 MG/ML  SOLN COMPARISON:  None Available. FINDINGS: Osseous: There is no acute facial bone fracture. There is no other acute osseous abnormality. There is no mandibular dislocation. There is no suspicious osseous lesion. The patient is edentulous. Orbits: The globes are unremarkable. The orbital fat is normal. The extraocular muscles are normal. Sinuses: There is mucosal thickening throughout the paranasal sinuses with layering fluid in the left maxillary sinus. Soft tissues: There is no appreciable soft tissue swelling. There is no organized or drainable fluid collection. There is no pathologic lymphadenopathy to the level imaged. Limited intracranial: The imaged portions of the intracranial compartment are unremarkable. IMPRESSION: 1. Mucosal thickening throughout the paranasal sinuses with layering fluid in the left maxillary sinus which may reflect acute sinusitis in the correct clinical setting. 2. No significant facial soft tissue  swelling. No abscess identified. Electronically Signed   By: Lesia Hausen M.D.   On: 09/13/2022 19:24    Procedures Procedures    Medications Ordered in ED Medications  iohexol (OMNIPAQUE) 300 MG/ML solution 75 mL (75 mLs Intravenous Contrast Given 09/13/22 1856)  HYDROcodone-acetaminophen (NORCO/VICODIN) 5-325 MG per tablet 1 tablet (1 tablet Oral Given 09/13/22 2004)  amoxicillin-clavulanate (AUGMENTIN) 875-125 MG per tablet 1 tablet (1 tablet Oral Given 09/13/22 2004)    ED Course/ Medical Decision Making/ A&P Clinical Course as of 09/13/22 2024  Tue Sep 13, 2022  1952 WBC(!): 19.2 [JL]    Clinical Course User Index [JL] Ernie Avena, MD                           Medical Decision Making Amount and/or Complexity of Data Reviewed Labs: ordered. Decision-making details  documented in ED Course. Radiology: ordered.  Risk Prescription drug management.    31 year old female presenting to the department with left-sided facial pain.  The patient states that for the last 2 weeks or so she has had nasal congestion and symptoms of sinusitis.  She states that over the last couple of days the pain has acutely worsened.  Endorses sharp pain in the left side of her face.  She states that the pain worsens starting on Sunday.  She has tried multiple over-the-counter pain medications.  She currently has no teeth as they have all been pulled previously by dentist.  She endorses swelling in the left side of her face.  Denies any numbness, tingling, swelling or pain with extraocular movements, swelling around the eye.  She denies any facial trauma.  On arrival, the patient was vitally stable.  Physical exam concerning for left-sided maxillary tenderness to palpation, no tenderness or swelling about the parotid, intact extraocular movements, no proptosis.  Differential diagnosis includes parable cellulitis, parotid abscess, other facial abscess, bacterial sinusitis.  Her evaluation significant for  BMP unremarkable, CBC with a leukocytosis to 19.2, blood cultures collected and pending, hCG normal.  The patient was administered Augmentin and Norco for pain control.  CT of the face was performed and revealed the following: IMPRESSION:  1. Mucosal thickening throughout the paranasal sinuses with layering  fluid in the left maxillary sinus which may reflect acute sinusitis  in the correct clinical setting.  2. No significant facial soft tissue swelling. No abscess  identified.   No other evidence of infectious complication.  No evidence of periorbital cellulitis.  No evidence of orbital cellulitis.  No intra facial abscess.  Patient is nonseptic and well-appearing on repeat assessment.  Overall tolerating oral intake.  Will prescribe Norco for pain control and placed the patient on a course of outpatient Augmentin.  Advised continued sinus rinses in addition to the above regimen.   Final Clinical Impression(s) / ED Diagnoses Final diagnoses:  Acute bacterial sinusitis    Rx / DC Orders ED Discharge Orders          Ordered    amoxicillin-clavulanate (AUGMENTIN) 875-125 MG tablet  Every 12 hours,   Status:  Discontinued        09/13/22 1952    HYDROcodone-acetaminophen (NORCO/VICODIN) 5-325 MG tablet  Every 6 hours PRN,   Status:  Discontinued        09/13/22 1952    sodium chloride (OCEAN) 0.65 % SOLN nasal spray  As needed        09/13/22 2017    amoxicillin-clavulanate (AUGMENTIN) 875-125 MG tablet  Every 12 hours        09/13/22 2017    HYDROcodone-acetaminophen (NORCO/VICODIN) 5-325 MG tablet  Every 6 hours PRN        11 /21/23 2017              2018, MD 09/13/22 2024

## 2022-09-13 NOTE — ED Notes (Signed)
Patient transported to CT 

## 2022-09-18 LAB — CULTURE, BLOOD (ROUTINE X 2)
Culture: NO GROWTH
Culture: NO GROWTH
Special Requests: ADEQUATE
Special Requests: ADEQUATE

## 2022-11-03 ENCOUNTER — Ambulatory Visit: Payer: Medicaid Other

## 2023-02-15 ENCOUNTER — Ambulatory Visit: Payer: Medicaid Other | Admitting: Family Medicine

## 2023-05-05 ENCOUNTER — Ambulatory Visit: Payer: Medicaid Other | Admitting: Family Medicine
